# Patient Record
Sex: Male | Born: 1935 | Race: White | Hispanic: No | Marital: Married | Smoking: Former smoker
Health system: Southern US, Community
[De-identification: ages and names within clinical notes are randomized; demographics above are authoritative.]

## PROBLEM LIST (undated history)

## (undated) DIAGNOSIS — J449 Chronic obstructive pulmonary disease, unspecified: Secondary | ICD-10-CM

## (undated) DIAGNOSIS — E785 Hyperlipidemia, unspecified: Secondary | ICD-10-CM

## (undated) DIAGNOSIS — G20 Parkinson's disease: Secondary | ICD-10-CM

## (undated) DIAGNOSIS — F419 Anxiety disorder, unspecified: Secondary | ICD-10-CM

## (undated) DIAGNOSIS — K219 Gastro-esophageal reflux disease without esophagitis: Secondary | ICD-10-CM

## (undated) DIAGNOSIS — I10 Essential (primary) hypertension: Secondary | ICD-10-CM

## (undated) DIAGNOSIS — J45909 Unspecified asthma, uncomplicated: Secondary | ICD-10-CM

## (undated) DIAGNOSIS — G20A1 Parkinson's disease without dyskinesia, without mention of fluctuations: Secondary | ICD-10-CM

---

## 2016-10-17 ENCOUNTER — Emergency Department (HOSPITAL_BASED_OUTPATIENT_CLINIC_OR_DEPARTMENT_OTHER): Payer: Medicare Other

## 2016-10-17 ENCOUNTER — Emergency Department (HOSPITAL_BASED_OUTPATIENT_CLINIC_OR_DEPARTMENT_OTHER)
Admission: EM | Admit: 2016-10-17 | Discharge: 2016-10-17 | Disposition: A | Discharge status: Home / Self Care | Payer: Medicare Other | Attending: Emergency Medicine | Admitting: Emergency Medicine

## 2016-10-17 ENCOUNTER — Encounter (HOSPITAL_BASED_OUTPATIENT_CLINIC_OR_DEPARTMENT_OTHER): Payer: Self-pay | Admitting: Emergency Medicine

## 2016-10-17 DIAGNOSIS — K117 Disturbances of salivary secretion: Secondary | ICD-10-CM | POA: Diagnosis not present

## 2016-10-17 DIAGNOSIS — G20 Parkinson's disease: Secondary | ICD-10-CM | POA: Diagnosis not present

## 2016-10-17 DIAGNOSIS — Z87891 Personal history of nicotine dependence: Secondary | ICD-10-CM | POA: Insufficient documentation

## 2016-10-17 DIAGNOSIS — Z7982 Long term (current) use of aspirin: Secondary | ICD-10-CM | POA: Insufficient documentation

## 2016-10-17 DIAGNOSIS — I10 Essential (primary) hypertension: Secondary | ICD-10-CM | POA: Diagnosis not present

## 2016-10-17 DIAGNOSIS — Z79899 Other long term (current) drug therapy: Secondary | ICD-10-CM | POA: Diagnosis not present

## 2016-10-17 DIAGNOSIS — J45909 Unspecified asthma, uncomplicated: Secondary | ICD-10-CM | POA: Diagnosis not present

## 2016-10-17 DIAGNOSIS — J449 Chronic obstructive pulmonary disease, unspecified: Secondary | ICD-10-CM | POA: Diagnosis not present

## 2016-10-17 HISTORY — DX: Parkinson's disease: G20

## 2016-10-17 HISTORY — DX: Gastro-esophageal reflux disease without esophagitis: K21.9

## 2016-10-17 HISTORY — DX: Unspecified asthma, uncomplicated: J45.909

## 2016-10-17 HISTORY — DX: Essential (primary) hypertension: I10

## 2016-10-17 HISTORY — DX: Anxiety disorder, unspecified: F41.9

## 2016-10-17 HISTORY — DX: Parkinson's disease without dyskinesia, without mention of fluctuations: G20.A1

## 2016-10-17 HISTORY — DX: Chronic obstructive pulmonary disease, unspecified: J44.9

## 2016-10-17 HISTORY — DX: Hyperlipidemia, unspecified: E78.5

## 2016-10-17 MED ORDER — DOXYCYCLINE HYCLATE 100 MG PO CAPS: 100.0000 mg | ORAL_CAPSULE | Freq: Two times a day (BID) | ORAL | 0 refills | Status: DC

## 2016-10-17 MED ORDER — HYDROCODONE-HOMATROPINE 5-1.5 MG/5ML PO SYRP: 5.0000 mL | ORAL_SOLUTION | Freq: Four times a day (QID) | ORAL | 0 refills | Status: DC | PRN

## 2016-10-17 NOTE — ED Notes (Signed)
Pt given d/c instructions as per chart. Verbalizes understanding. No questions. 

## 2016-10-17 NOTE — Discharge Instructions (Signed)
You may stop taking Augmentin as this may have caused your painful swallowing. Please follow up with your ENT and gastroenterologist for your difficulty swallowing.  Return to the ED for sudden worsening symptoms, cough, choking, fever, or any new or concerning symptoms.

## 2016-10-17 NOTE — ED Triage Notes (Signed)
Pt wife sts pt began drooling about 1 hr ago; drooling is normal for pt, but this was more than usual; pt c/o nausea and vomited x 6

## 2016-10-17 NOTE — ED Notes (Addendum)
Pt has been taking an abx (can't remember the name or what its for) for 1 week. Pt states he took the dose today and an hour afterward started drooling a lot. Pt states he swallowed his saliva and then vomited several times. Pt wife states the drool was so bad it was all over the floor. Pt normally has difficulty swallowing due to parkinson's. Pt states he feel fine now. Pt wife states he appeared to have some difficulty breathing.

## 2016-10-17 NOTE — ED Notes (Signed)
Pt given sprite. Per pt and wife, able to drink as normal.

## 2016-10-17 NOTE — ED Provider Notes (Signed)
[facility]   CSN: [id number] Arrival date & time: 10/17/16  1335     History   Chief Complaint Chief Complaint  Patient presents with  . Drooling    HPI Casey Moses is a 80 y.o. male.  HPI Casey Moses is a 80 y.o. male with PMH significant for anxiety, asthma, COPD, GERD, HLD, HTN, and Parkinson's disease who presents with one episode of now resolved drooling that occurred 3 hours ago.  Patient states he was eating apple sauce with his antibiotic(Augmentin) mashed in it when he experienced painful swallowing causing him to vomit it back up and drool.  He has since returned to baseline.  He has issues with swallowing for which he sees ENT.  No numbness, weakness, facial droop, or slurred speech. He denies severe headache, confusion, chest pain, or shortness of breath.  Past Medical History:  Diagnosis Date  . Anxiety   . Asthma   . COPD (chronic obstructive pulmonary disease) (HCC)   . GERD (gastroesophageal reflux disease)   . Hyperlipidemia   . Hypertension   . Parkinson's disease (HCC)     There are no active problems to display for this patient.   History reviewed. No pertinent surgical history.     Home Medications    Prior to Admission medications   Medication Sig Start Date End Date Taking? Authorizing Provider  ALPRAZolam Prudy Feeler(XANAX) 0.5 MG tablet Take 0.5 mg by mouth at bedtime as needed for anxiety.   Yes Historical Provider, MD  aspirin EC 81 MG tablet Take 81 mg by mouth daily.   Yes Historical Provider, MD  atenolol (TENORMIN) 50 MG tablet Take 50 mg by mouth daily.   Yes Historical Provider, MD  atorvastatin (LIPITOR) 40 MG tablet Take 40 mg by mouth daily.   Yes Historical Provider, MD  carbidopa-levodopa (SINEMET IR) 10-100 MG tablet Take 1 tablet by mouth 3 (three) times daily.   Yes Historical Provider, MD  donepezil (ARICEPT) 5 MG tablet Take 5 mg by mouth at bedtime.   Yes Historical Provider, MD    Fluticasone-Salmeterol (ADVAIR) 100-50 MCG/DOSE AEPB Inhale 1 puff into the lungs 2 (two) times daily.   Yes Historical Provider, MD  omeprazole (PRILOSEC) 10 MG capsule Take 10 mg by mouth daily.   Yes Historical Provider, MD  sertraline (ZOLOFT) 50 MG tablet Take 50 mg by mouth daily.   Yes Historical Provider, MD  tamsulosin (FLOMAX) 0.4 MG CAPS capsule Take 0.4 mg by mouth.   Yes Historical Provider, MD    Family History History reviewed. No pertinent family history.  Social History Social History  Substance Use Topics  . Smoking status: Former Games developermoker  . Smokeless tobacco: Never Used  . Alcohol use Yes     Comment: wine occ     Allergies   Patient has no known allergies.   Review of Systems Review of Systems All other systems negative unless otherwise stated in HPI   Physical Exam Updated Vital Signs BP (!) 203/93 (BP Location: Left Arm)   Pulse 62   Temp 97.8 F (36.6 C) (Oral)   Resp 18   Ht 5\' 10"  (1.778 m)   Wt 74.8 kg   SpO2 97%   BMI 23.68 kg/m   Physical Exam  Constitutional: He is oriented to person, place, and time. He appears well-developed and well-nourished.  Non-toxic appearance. He does not have a sickly appearance. He does not appear ill.  HENT:  Head: Normocephalic and atraumatic.  Mouth/Throat: Oropharynx is clear and moist.  Eyes: Conjunctivae are normal. Pupils are equal, round, and reactive to light.  Neck: Normal range of motion. Neck supple.  Cardiovascular: Normal rate and regular rhythm.   Pulmonary/Chest: Effort normal and breath sounds normal. No accessory muscle usage or stridor. No respiratory distress. He has no wheezes. He has no rhonchi. He has no rales.  Abdominal: Soft. Bowel sounds are normal. He exhibits no distension. There is no tenderness.  Musculoskeletal: Normal range of motion.  Lymphadenopathy:    He has no cervical adenopathy.  Neurological: He is alert and oriented to person, place, and time.  Speech clear  without dysarthria. Cranial nerves grossly intact.  Normal strength and sensation throughout bilateral upper and lower extremities.  No pronator drift.  Normal finger to nose bilaterally.  Tremor to lips and right arm.   Skin: Skin is warm and dry.  Psychiatric: He has a normal mood and affect. His behavior is normal.     ED Treatments / Results  Labs (all labs ordered are listed, but only abnormal results are displayed) Labs Reviewed - No data to display  EKG  EKG Interpretation None       Radiology No results found.  Procedures Procedures (including critical care time)  Medications Ordered in ED Medications - No data to display   Initial Impression / Assessment and Plan / ED Course  I have reviewed the triage vital signs and the nursing notes.  Pertinent labs & imaging results that were available during my care of the patient were reviewed by me and considered in my medical decision making (see chart for details).  Clinical Course    Patient presents with now resolved episode of drooling. Patient with history of dysphasia and drooling due to Parkinson's disease. No focal neurologic deficits on exam. Patient states he is at his baseline. He is able to tolerate by mouth without difficulty. Could be related to esophagitis due to Augmentin as this is when it occurred. Recommend stopping this. His BP is elevated; however, he has no chest pain, shortness of breath, altered mental status, or any other concerning symptoms. Plan to discharge home with ENT and gastroenterology follow-up. Return precautions discussed. Stable for discharge.  Case has been discussed with and seen by Dr. Madilyn Hookees who agrees with the above plan for discharge.  Final Clinical Impressions(s) / ED Diagnoses   Final diagnoses:  Drooling    New Prescriptions Current Discharge Medication List       Cheri FowlerKayla Talik Casique, Cordelia Poche-C 10/17/16 1616    Tilden FossaElizabeth Rees, MD 10/18/16 1355

## 2017-10-16 ENCOUNTER — Encounter (HOSPITAL_BASED_OUTPATIENT_CLINIC_OR_DEPARTMENT_OTHER): Payer: Self-pay | Admitting: *Deleted

## 2017-10-16 ENCOUNTER — Emergency Department (HOSPITAL_BASED_OUTPATIENT_CLINIC_OR_DEPARTMENT_OTHER): Payer: Medicare Other

## 2017-10-16 ENCOUNTER — Emergency Department (HOSPITAL_BASED_OUTPATIENT_CLINIC_OR_DEPARTMENT_OTHER)
Admission: EM | Admit: 2017-10-16 | Discharge: 2017-10-16 | Disposition: A | Discharge status: Home / Self Care | Payer: Medicare Other | Attending: Emergency Medicine | Admitting: Emergency Medicine

## 2017-10-16 ENCOUNTER — Other Ambulatory Visit: Payer: Self-pay

## 2017-10-16 DIAGNOSIS — R531 Weakness: Secondary | ICD-10-CM

## 2017-10-16 DIAGNOSIS — Z79899 Other long term (current) drug therapy: Secondary | ICD-10-CM | POA: Insufficient documentation

## 2017-10-16 DIAGNOSIS — I10 Essential (primary) hypertension: Secondary | ICD-10-CM | POA: Insufficient documentation

## 2017-10-16 DIAGNOSIS — J449 Chronic obstructive pulmonary disease, unspecified: Secondary | ICD-10-CM | POA: Insufficient documentation

## 2017-10-16 DIAGNOSIS — Z87891 Personal history of nicotine dependence: Secondary | ICD-10-CM | POA: Diagnosis not present

## 2017-10-16 DIAGNOSIS — G20 Parkinson's disease: Secondary | ICD-10-CM | POA: Diagnosis not present

## 2017-10-16 DIAGNOSIS — J45909 Unspecified asthma, uncomplicated: Secondary | ICD-10-CM | POA: Insufficient documentation

## 2017-10-16 DIAGNOSIS — R001 Bradycardia, unspecified: Secondary | ICD-10-CM | POA: Diagnosis not present

## 2017-10-16 DIAGNOSIS — R42 Dizziness and giddiness: Secondary | ICD-10-CM | POA: Diagnosis present

## 2017-10-16 DIAGNOSIS — Z7982 Long term (current) use of aspirin: Secondary | ICD-10-CM | POA: Insufficient documentation

## 2017-10-16 DIAGNOSIS — E785 Hyperlipidemia, unspecified: Secondary | ICD-10-CM | POA: Diagnosis not present

## 2017-10-16 LAB — COMPREHENSIVE METABOLIC PANEL
ALBUMIN: 4.2 g/dL (ref 3.5–5.0)
ALK PHOS: 67 U/L (ref 38–126)
ALT: 10 U/L — ABNORMAL LOW (ref 17–63)
ANION GAP: 9 (ref 5–15)
AST: 27 U/L (ref 15–41)
BUN: 21 mg/dL — ABNORMAL HIGH (ref 6–20)
CHLORIDE: 102 mmol/L (ref 101–111)
CO2: 28 mmol/L (ref 22–32)
Calcium: 8.8 mg/dL — ABNORMAL LOW (ref 8.9–10.3)
Creatinine, Ser: 1.15 mg/dL (ref 0.61–1.24)
GFR calc Af Amer: >60 mL/min (ref >60)
GFR calc non Af Amer: 59 mL/min — ABNORMAL LOW (ref >60)
GLUCOSE: 107 mg/dL — AB (ref 65–99)
POTASSIUM: 3.5 mmol/L (ref 3.5–5.1)
SODIUM: 139 mmol/L (ref 135–145)
Total Bilirubin: 1.3 mg/dL — ABNORMAL HIGH (ref 0.3–1.2)
Total Protein: 6.8 g/dL (ref 6.5–8.1)

## 2017-10-16 LAB — URINALYSIS, ROUTINE W REFLEX MICROSCOPIC
Bilirubin Urine: NEGATIVE
GLUCOSE, UA: NEGATIVE mg/dL
Hgb urine dipstick: NEGATIVE
Ketones, ur: 15 mg/dL — AB
NITRITE: NEGATIVE
PH: 6 (ref 5.0–8.0)
Protein, ur: 100 mg/dL — AB
SPECIFIC GRAVITY, URINE: 1.025 (ref 1.005–1.030)

## 2017-10-16 LAB — CBC
HEMATOCRIT: 42.9 % (ref 39.0–52.0)
HEMOGLOBIN: 14.7 g/dL (ref 13.0–17.0)
MCH: 31.7 pg (ref 26.0–34.0)
MCHC: 34.3 g/dL (ref 30.0–36.0)
MCV: 92.7 fL (ref 78.0–100.0)
PLATELETS: 192 10*3/uL (ref 150–400)
RBC: 4.63 MIL/uL (ref 4.22–5.81)
RDW: 13.1 % (ref 11.5–15.5)
WBC: 8.1 10*3/uL (ref 4.0–10.5)

## 2017-10-16 LAB — URINALYSIS, MICROSCOPIC (REFLEX)

## 2017-10-16 MED ORDER — ATENOLOL 25 MG PO TABS: 25.0000 mg | ORAL_TABLET | Freq: Every day | ORAL | 0 refills | Status: DC

## 2017-10-16 MED ORDER — CEPHALEXIN 500 MG PO CAPS: 500.0000 mg | ORAL_CAPSULE | Freq: Three times a day (TID) | ORAL | 0 refills | Status: DC

## 2017-10-16 MED ORDER — CEPHALEXIN 250 MG PO CAPS
500.0000 mg | ORAL_CAPSULE | Freq: Once | ORAL | Status: AC
  Administered 2017-10-16: 500 mg via ORAL
  Filled 2017-10-16: qty 2

## 2017-10-16 NOTE — Discharge Instructions (Signed)
It was our pleasure to provide your ER care today - we hope that you feel better.  Drink plenty of fluids (6-8 glasses of water per day).  Consider supplementing diet with nutritious shake such as boost or ensure.   The lab tests show a possible urine infection - take keflex (antibiotic) as prescribed.  Your heart rate is low (50) - decrease the dose of your atenolol to 25 mg a day (instead of 50 mg a day).   Follow up with your doctor next week for recheck.  Return to ER if worse, new symptoms, fainting, trouble breathing, high  fevers, other concern.

## 2017-10-16 NOTE — ED Notes (Signed)
Patient transported to CT 

## 2017-10-16 NOTE — ED Triage Notes (Signed)
Wife states dizziness x 5 days

## 2017-10-16 NOTE — ED Notes (Signed)
Pt drinking soda

## 2017-10-16 NOTE — ED Notes (Signed)
Pt unable to void at this time. 

## 2017-10-16 NOTE — ED Provider Notes (Signed)
[facility] EMERGENCY DEPARTMENT Provider Note   CSN: [id number] Arrival date & time: 10/16/17  [id number]     History   Chief Complaint Chief Complaint  Patient presents with  . Dizziness    HPI Casey Moses is a 81 y.o. male.  Patient c/o lightheadedness, shakiness, and generalized weakness in the past 3-4 days. Symptoms gradual onset, persistent, constant. Symptoms occur at rest, no relation to exertion. No syncope, but feels weak/lightheaded/dizzy when stands. Denies room spinning.  Did fall 2 weeks ago - denies specific injury then. Denies headache. No neck or back pain. Patient denies fever or chills. No cough or uri c/o. No chest pain or discomfort. Denies abd pain. No nvd. Spouse indicates drinks relatively little in fluids/water. Pt denies gu c/o, no dysuria or odor to urine. Denies unilateral or focal numbness or weakness. No change in speech or vision. Denies any recent change in meds.    The history is provided by the patient.  Dizziness  Associated symptoms: no chest pain, no diarrhea, no headaches, no shortness of breath and no vomiting     Past Medical History:  Diagnosis Date  . Anxiety   . Asthma   . COPD (chronic obstructive pulmonary disease) (HCC)   . GERD (gastroesophageal reflux disease)   . Hyperlipidemia   . Hypertension   . Parkinson's disease (HCC)     There are no active problems to display for this patient.   History reviewed. No pertinent surgical history.     Home Medications    Prior to Admission medications   Medication Sig Start Date End Date Taking? Authorizing Provider  ALPRAZolam Prudy Feeler(XANAX) 0.5 MG tablet Take 0.5 mg by mouth at bedtime as needed for anxiety.    [provider]  aspirin EC 81 MG tablet Take 81 mg by mouth daily.    [provider]  atenolol (TENORMIN) 50 MG tablet Take 50 mg by mouth daily.    [provider]  atorvastatin (LIPITOR) 40 MG tablet Take 40 mg by mouth daily.     [provider]  carbidopa-levodopa (SINEMET IR) 10-100 MG tablet Take 1 tablet by mouth 3 (three) times daily.    [provider]  donepezil (ARICEPT) 5 MG tablet Take 5 mg by mouth at bedtime.    [provider]  Fluticasone-Salmeterol (ADVAIR) 100-50 MCG/DOSE AEPB Inhale 1 puff into the lungs 2 (two) times daily.    [provider]  omeprazole (PRILOSEC) 10 MG capsule Take 10 mg by mouth daily.    [provider]  sertraline (ZOLOFT) 50 MG tablet Take 50 mg by mouth daily.    [provider]  tamsulosin (FLOMAX) 0.4 MG CAPS capsule Take 0.4 mg by mouth.    [provider]    Family History History reviewed. No pertinent family history.  Social History Social History   Tobacco Use  . Smoking status: Former Games developermoker  . Smokeless tobacco: Never Used  Substance Use Topics  . Alcohol use: Yes    Comment: wine occ  . Drug use: No     Allergies   Patient has no known allergies.   Review of Systems Review of Systems  Constitutional: Negative for fever.  HENT: Negative for sore throat.   Eyes: Negative for visual disturbance.  Respiratory: Negative for shortness of breath.   Cardiovascular: Negative for chest pain.  Gastrointestinal: Negative for abdominal pain, diarrhea and vomiting.  Genitourinary: Negative for flank pain.  Musculoskeletal: Negative for back pain  and neck pain.  Skin: Negative for rash.  Neurological: Positive for dizziness and light-headedness. Negative for headaches.  Hematological: Does not bruise/bleed easily.  Psychiatric/Behavioral: Negative for confusion.     Physical Exam Updated Vital Signs BP (!) 147/87   Pulse (!) 54   Temp 98.2 F (36.8 C)   Resp 16   Ht 1.765 m (5' 9.5")   Wt 74.8 kg (165 lb)   SpO2 98%   BMI 24.02 kg/m   Physical Exam  Constitutional: He appears well-developed and well-nourished. No distress.  HENT:  Mouth/Throat: Oropharynx is clear and moist.  Eyes:  Conjunctivae are normal.  Neck: Neck supple. No tracheal deviation present. No thyromegaly present.  No bruits.   Cardiovascular: Regular rhythm, normal heart sounds and intact distal pulses. Exam reveals no gallop and no friction rub.  No murmur heard. Mildly bradycardic.   Pulmonary/Chest: Effort normal and breath sounds normal. No accessory muscle usage. No respiratory distress.  Abdominal: Soft. Bowel sounds are normal. He exhibits no distension. There is no tenderness.  Genitourinary:  Genitourinary Comments: No cva tenderness  Musculoskeletal: He exhibits no edema.  Neurological: He is alert.  Tremor (hx Parkinsons). Motor intact bil, stre 5/5. sens grossly intact. No pronator drift. Steady gait.  Skin: Skin is warm and dry. No rash noted. He is not diaphoretic.  Psychiatric: He has a normal mood and affect.  Nursing note and vitals reviewed.    ED Treatments / Results  Labs (all labs ordered are listed, but only abnormal results are displayed) Results for orders placed or performed during the hospital encounter of 10/16/17  CBC  Result Value Ref Range   WBC 8.1 4.0 - 10.5 K/uL   RBC 4.63 4.22 - 5.81 MIL/uL   Hemoglobin 14.7 13.0 - 17.0 g/dL   HCT 69.6 29.5 - 28.4 %   MCV 92.7 78.0 - 100.0 fL   MCH 31.7 26.0 - 34.0 pg   MCHC 34.3 30.0 - 36.0 g/dL   RDW 13.2 44.0 - 10.2 %   Platelets 192 150 - 400 K/uL  Comprehensive metabolic panel  Result Value Ref Range   Sodium 139 135 - 145 mmol/L   Potassium 3.5 3.5 - 5.1 mmol/L   Chloride 102 101 - 111 mmol/L   CO2 28 22 - 32 mmol/L   Glucose, Bld 107 (H) 65 - 99 mg/dL   BUN 21 (H) 6 - 20 mg/dL   Creatinine, Ser 7.25 0.61 - 1.24 mg/dL   Calcium 8.8 (L) 8.9 - 10.3 mg/dL   Total Protein 6.8 6.5 - 8.1 g/dL   Albumin 4.2 3.5 - 5.0 g/dL   AST 27 15 - 41 U/L   ALT 10 (L) 17 - 63 U/L   Alkaline Phosphatase 67 38 - 126 U/L   Total Bilirubin 1.3 (H) 0.3 - 1.2 mg/dL   GFR calc non Af Amer 59 (L) >60 mL/min   GFR calc Af Amer >60  >60 mL/min   Anion gap 9 5 - 15  Urinalysis, Routine w reflex microscopic  Result Value Ref Range   Color, Urine AMBER (A) YELLOW   APPearance CLEAR CLEAR   Specific Gravity, Urine 1.025 1.005 - 1.030   pH 6.0 5.0 - 8.0   Glucose, UA NEGATIVE NEGATIVE mg/dL   Hgb urine dipstick NEGATIVE NEGATIVE   Bilirubin Urine NEGATIVE NEGATIVE   Ketones, ur 15 (A) NEGATIVE mg/dL   Protein, ur 366 (A) NEGATIVE mg/dL   Nitrite NEGATIVE NEGATIVE   Leukocytes, UA TRACE (  A) NEGATIVE  Urinalysis, Microscopic (reflex)  Result Value Ref Range   RBC / HPF 0-5 0 - 5 RBC/hpf   WBC, UA 0-5 0 - 5 WBC/hpf   Bacteria, UA MANY (A) NONE SEEN   Squamous Epithelial / LPF 0-5 (A) NONE SEEN   Mucus PRESENT    Hyaline Casts, UA PRESENT    Ca Oxalate Crys, UA PRESENT    Sperm, UA PRESENT    Ct Head Wo Contrast  Result Date: 10/16/2017 CLINICAL DATA:  Dizziness, hypertension EXAM: CT HEAD WITHOUT CONTRAST TECHNIQUE: Contiguous axial images were obtained from the base of the skull through the vertex without intravenous contrast. COMPARISON:  None. FINDINGS: Brain: Mild age related volume loss. No acute intracranial abnormality. Specifically, no hemorrhage, hydrocephalus, mass lesion, acute infarction, or significant intracranial injury. Vascular: No hyperdense vessel or unexpected calcification. Skull: No acute calvarial abnormality. Sinuses/Orbits: No acute finding. Other: None. IMPRESSION: No acute intracranial abnormality. Electronically Signed   By: Charlett NoseKevin  Dover M.D.   On: 10/16/2017 12:32    EKG  EKG Interpretation None       Radiology No results found.  Procedures Procedures (including critical care time)  Medications Ordered in ED Medications - No data to display   Initial Impression / Assessment and Plan / ED Course  I have reviewed the triage vital signs and the nursing notes.  Pertinent labs & imaging results that were available during my care of the patient were reviewed by me and considered  in my medical decision making (see chart for details).  Labs. Imaging study.   Reviewed nursing notes and prior charts for additional history.   Ecg.  Symptoms likely multifactorial.  Pt w poor po intake/poor po fluids - ua c/w mild dehydration. Patient w hr as low as 50, is on beta blocker, will cut dose in 1/2.  Possible uti on labs w many bacteria, will tx.   Pt taking po fluids.  Afebrile. Mental status c/w baseline.     Final Clinical Impressions(s) / ED Diagnoses   Final diagnoses:  None    ED Discharge Orders    None       Cathren LaineSteinl, Jordin Dambrosio, MD 10/16/17 1428

## 2017-10-16 NOTE — ED Notes (Signed)
Given soda per request

## 2017-10-16 NOTE — ED Notes (Signed)
Patient is A & O to his baseline.  Wife understood AVS instructions.

## 2020-02-24 ENCOUNTER — Inpatient Hospital Stay (HOSPITAL_BASED_OUTPATIENT_CLINIC_OR_DEPARTMENT_OTHER)
Admission: EM | Admit: 2020-02-24 | Discharge: 2020-03-16 | DRG: 085 | Disposition: E | Payer: Medicare Other | Attending: General Surgery | Admitting: General Surgery

## 2020-02-24 ENCOUNTER — Emergency Department (HOSPITAL_BASED_OUTPATIENT_CLINIC_OR_DEPARTMENT_OTHER): Payer: Medicare Other

## 2020-02-24 ENCOUNTER — Encounter (HOSPITAL_BASED_OUTPATIENT_CLINIC_OR_DEPARTMENT_OTHER): Payer: Self-pay | Admitting: Emergency Medicine

## 2020-02-24 ENCOUNTER — Other Ambulatory Visit: Payer: Self-pay

## 2020-02-24 ENCOUNTER — Observation Stay (HOSPITAL_BASED_OUTPATIENT_CLINIC_OR_DEPARTMENT_OTHER): Payer: Medicare Other

## 2020-02-24 DIAGNOSIS — Z681 Body mass index (BMI) 19 or less, adult: Secondary | ICD-10-CM | POA: Diagnosis not present

## 2020-02-24 DIAGNOSIS — S06360A Traumatic hemorrhage of cerebrum, unspecified, without loss of consciousness, initial encounter: Secondary | ICD-10-CM | POA: Diagnosis present

## 2020-02-24 DIAGNOSIS — H04129 Dry eye syndrome of unspecified lacrimal gland: Secondary | ICD-10-CM | POA: Diagnosis present

## 2020-02-24 DIAGNOSIS — G9341 Metabolic encephalopathy: Secondary | ICD-10-CM | POA: Diagnosis not present

## 2020-02-24 DIAGNOSIS — F0280 Dementia in other diseases classified elsewhere without behavioral disturbance: Secondary | ICD-10-CM | POA: Diagnosis present

## 2020-02-24 DIAGNOSIS — R471 Dysarthria and anarthria: Secondary | ICD-10-CM | POA: Diagnosis present

## 2020-02-24 DIAGNOSIS — E43 Unspecified severe protein-calorie malnutrition: Secondary | ICD-10-CM | POA: Diagnosis present

## 2020-02-24 DIAGNOSIS — K219 Gastro-esophageal reflux disease without esophagitis: Secondary | ICD-10-CM | POA: Diagnosis present

## 2020-02-24 DIAGNOSIS — S12110A Anterior displaced Type II dens fracture, initial encounter for closed fracture: Secondary | ICD-10-CM | POA: Diagnosis present

## 2020-02-24 DIAGNOSIS — Z888 Allergy status to other drugs, medicaments and biological substances status: Secondary | ICD-10-CM

## 2020-02-24 DIAGNOSIS — R4182 Altered mental status, unspecified: Secondary | ICD-10-CM | POA: Diagnosis not present

## 2020-02-24 DIAGNOSIS — D72829 Elevated white blood cell count, unspecified: Secondary | ICD-10-CM

## 2020-02-24 DIAGNOSIS — S80211A Abrasion, right knee, initial encounter: Secondary | ICD-10-CM | POA: Diagnosis present

## 2020-02-24 DIAGNOSIS — F419 Anxiety disorder, unspecified: Secondary | ICD-10-CM | POA: Diagnosis present

## 2020-02-24 DIAGNOSIS — E870 Hyperosmolality and hypernatremia: Secondary | ICD-10-CM | POA: Diagnosis not present

## 2020-02-24 DIAGNOSIS — E785 Hyperlipidemia, unspecified: Secondary | ICD-10-CM | POA: Diagnosis present

## 2020-02-24 DIAGNOSIS — R1310 Dysphagia, unspecified: Secondary | ICD-10-CM | POA: Diagnosis present

## 2020-02-24 DIAGNOSIS — Z9181 History of falling: Secondary | ICD-10-CM | POA: Diagnosis not present

## 2020-02-24 DIAGNOSIS — S12100A Unspecified displaced fracture of second cervical vertebra, initial encounter for closed fracture: Secondary | ICD-10-CM

## 2020-02-24 DIAGNOSIS — Z87891 Personal history of nicotine dependence: Secondary | ICD-10-CM

## 2020-02-24 DIAGNOSIS — Z515 Encounter for palliative care: Secondary | ICD-10-CM | POA: Diagnosis not present

## 2020-02-24 DIAGNOSIS — Z7189 Other specified counseling: Secondary | ICD-10-CM | POA: Diagnosis not present

## 2020-02-24 DIAGNOSIS — Z66 Do not resuscitate: Secondary | ICD-10-CM

## 2020-02-24 DIAGNOSIS — Z7951 Long term (current) use of inhaled steroids: Secondary | ICD-10-CM

## 2020-02-24 DIAGNOSIS — W010XXA Fall on same level from slipping, tripping and stumbling without subsequent striking against object, initial encounter: Secondary | ICD-10-CM | POA: Diagnosis present

## 2020-02-24 DIAGNOSIS — E876 Hypokalemia: Secondary | ICD-10-CM | POA: Diagnosis present

## 2020-02-24 DIAGNOSIS — Z20822 Contact with and (suspected) exposure to covid-19: Secondary | ICD-10-CM | POA: Diagnosis present

## 2020-02-24 DIAGNOSIS — I10 Essential (primary) hypertension: Secondary | ICD-10-CM | POA: Diagnosis present

## 2020-02-24 DIAGNOSIS — N390 Urinary tract infection, site not specified: Secondary | ICD-10-CM | POA: Diagnosis present

## 2020-02-24 DIAGNOSIS — J449 Chronic obstructive pulmonary disease, unspecified: Secondary | ICD-10-CM | POA: Diagnosis present

## 2020-02-24 DIAGNOSIS — Z7982 Long term (current) use of aspirin: Secondary | ICD-10-CM

## 2020-02-24 DIAGNOSIS — S60511A Abrasion of right hand, initial encounter: Secondary | ICD-10-CM | POA: Diagnosis present

## 2020-02-24 DIAGNOSIS — R0602 Shortness of breath: Secondary | ICD-10-CM

## 2020-02-24 DIAGNOSIS — S0633AA Contusion and laceration of cerebrum, unspecified, with loss of consciousness status unknown, initial encounter: Secondary | ICD-10-CM

## 2020-02-24 DIAGNOSIS — G20 Parkinson's disease: Secondary | ICD-10-CM | POA: Diagnosis present

## 2020-02-24 DIAGNOSIS — Z781 Physical restraint status: Secondary | ICD-10-CM

## 2020-02-24 DIAGNOSIS — I609 Nontraumatic subarachnoid hemorrhage, unspecified: Principal | ICD-10-CM

## 2020-02-24 DIAGNOSIS — R402434 Glasgow coma scale score 3-8, 24 hours or more after hospital admission: Secondary | ICD-10-CM | POA: Diagnosis not present

## 2020-02-24 DIAGNOSIS — R451 Restlessness and agitation: Secondary | ICD-10-CM | POA: Diagnosis not present

## 2020-02-24 DIAGNOSIS — Z79899 Other long term (current) drug therapy: Secondary | ICD-10-CM

## 2020-02-24 DIAGNOSIS — Z8249 Family history of ischemic heart disease and other diseases of the circulatory system: Secondary | ICD-10-CM

## 2020-02-24 LAB — BASIC METABOLIC PANEL
Anion gap: 10 (ref 5–15)
BUN: 21 mg/dL (ref 8–23)
CO2: 25 mmol/L (ref 22–32)
Calcium: 8.6 mg/dL — ABNORMAL LOW (ref 8.9–10.3)
Chloride: 106 mmol/L (ref 98–111)
Creatinine, Ser: 1.09 mg/dL (ref 0.61–1.24)
GFR calc Af Amer: >60 mL/min (ref >60)
GFR calc non Af Amer: >60 mL/min (ref >60)
Glucose, Bld: 101 mg/dL — ABNORMAL HIGH (ref 70–99)
Potassium: 3.4 mmol/L — ABNORMAL LOW (ref 3.5–5.1)
Sodium: 141 mmol/L (ref 135–145)

## 2020-02-24 LAB — CBC
HCT: 38.9 % — ABNORMAL LOW (ref 39.0–52.0)
Hemoglobin: 12.4 g/dL — ABNORMAL LOW (ref 13.0–17.0)
MCH: 29.2 pg (ref 26.0–34.0)
MCHC: 31.9 g/dL (ref 30.0–36.0)
MCV: 91.7 fL (ref 80.0–100.0)
Platelets: 234 10*3/uL (ref 150–400)
RBC: 4.24 MIL/uL (ref 4.22–5.81)
RDW: 14.4 % (ref 11.5–15.5)
WBC: 12.2 10*3/uL — ABNORMAL HIGH (ref 4.0–10.5)
nRBC: 0 % (ref 0.0–0.2)

## 2020-02-24 LAB — RESPIRATORY PANEL BY RT PCR (FLU A&B, COVID)
Influenza A by PCR: NEGATIVE
Influenza B by PCR: NEGATIVE
SARS Coronavirus 2 by RT PCR: NEGATIVE

## 2020-02-24 LAB — MRSA PCR SCREENING: MRSA by PCR: NEGATIVE

## 2020-02-24 MED ORDER — DONEPEZIL HCL 10 MG PO TABS
10.0000 mg | ORAL_TABLET | Freq: Every day | ORAL | Status: DC
  Administered 2020-02-24 – 2020-02-25 (×2): 10 mg via ORAL
  Filled 2020-02-24 (×2): qty 1

## 2020-02-24 MED ORDER — OXYCODONE HCL 5 MG PO TABS: 2.5000 mg | ORAL_TABLET | ORAL | Status: DC | PRN

## 2020-02-24 MED ORDER — DOCUSATE SODIUM 100 MG PO CAPS
100.0000 mg | ORAL_CAPSULE | Freq: Two times a day (BID) | ORAL | Status: DC
  Administered 2020-02-24 – 2020-02-25 (×2): 100 mg via ORAL
  Filled 2020-02-24 (×2): qty 1

## 2020-02-24 MED ORDER — ACETAMINOPHEN 500 MG PO TABS
1000.0000 mg | ORAL_TABLET | Freq: Once | ORAL | Status: AC
  Administered 2020-02-24: 1000 mg via ORAL
  Filled 2020-02-24: qty 2

## 2020-02-24 MED ORDER — AMLODIPINE BESYLATE 5 MG PO TABS
5.0000 mg | ORAL_TABLET | Freq: Every day | ORAL | Status: DC
  Administered 2020-02-24: 5 mg via ORAL
  Filled 2020-02-24 (×2): qty 1

## 2020-02-24 MED ORDER — ONDANSETRON 4 MG PO TBDP: 4.0000 mg | ORAL_TABLET | Freq: Four times a day (QID) | ORAL | Status: DC | PRN

## 2020-02-24 MED ORDER — OXYCODONE HCL 5 MG PO TABS
2.5000 mg | ORAL_TABLET | Freq: Once | ORAL | Status: AC
  Administered 2020-02-24: 2.5 mg via ORAL
  Filled 2020-02-24: qty 1

## 2020-02-24 MED ORDER — HALOPERIDOL LACTATE 5 MG/ML IJ SOLN
5.0000 mg | Freq: Four times a day (QID) | INTRAMUSCULAR | Status: DC | PRN
  Administered 2020-02-24: 5 mg via INTRAVENOUS
  Filled 2020-02-24: qty 1

## 2020-02-24 MED ORDER — LORAZEPAM 2 MG/ML IJ SOLN
0.5000 mg | INTRAMUSCULAR | Status: DC | PRN
  Administered 2020-02-24 – 2020-02-26 (×3): 0.5 mg via INTRAVENOUS
  Filled 2020-02-24 (×4): qty 1

## 2020-02-24 MED ORDER — CHLORHEXIDINE GLUCONATE CLOTH 2 % EX PADS
6.0000 | MEDICATED_PAD | Freq: Every day | CUTANEOUS | Status: DC
  Administered 2020-02-26 – 2020-03-01 (×5): 6 via TOPICAL

## 2020-02-24 MED ORDER — CARBIDOPA-LEVODOPA ER 50-200 MG PO TBCR
1.0000 | EXTENDED_RELEASE_TABLET | Freq: Three times a day (TID) | ORAL | Status: DC
  Administered 2020-02-24 – 2020-02-25 (×2): 1 via ORAL
  Filled 2020-02-24 (×5): qty 1

## 2020-02-24 MED ORDER — TAMSULOSIN HCL 0.4 MG PO CAPS
0.4000 mg | ORAL_CAPSULE | Freq: Every day | ORAL | Status: DC
  Administered 2020-02-24 – 2020-03-01 (×5): 0.4 mg via ORAL
  Filled 2020-02-24 (×6): qty 1

## 2020-02-24 MED ORDER — ACETAMINOPHEN 325 MG PO TABS: 650.0000 mg | ORAL_TABLET | ORAL | Status: DC | PRN

## 2020-02-24 MED ORDER — LEVETIRACETAM IN NACL 1000 MG/100ML IV SOLN
1000.0000 mg | Freq: Once | INTRAVENOUS | Status: AC
  Administered 2020-02-24: 1000 mg via INTRAVENOUS
  Filled 2020-02-24: qty 100

## 2020-02-24 MED ORDER — MOMETASONE FURO-FORMOTEROL FUM 100-5 MCG/ACT IN AERO
2.0000 | INHALATION_SPRAY | Freq: Two times a day (BID) | RESPIRATORY_TRACT | Status: DC
  Administered 2020-02-28 – 2020-02-29 (×4): 2 via RESPIRATORY_TRACT
  Filled 2020-02-24: qty 8.8

## 2020-02-24 MED ORDER — BOOST / RESOURCE BREEZE PO LIQD CUSTOM
1.0000 | Freq: Three times a day (TID) | ORAL | Status: DC
  Administered 2020-02-24 – 2020-02-25 (×2): 1 via ORAL

## 2020-02-24 MED ORDER — SERTRALINE HCL 50 MG PO TABS
100.0000 mg | ORAL_TABLET | Freq: Every day | ORAL | Status: DC
  Administered 2020-02-24: 100 mg via ORAL
  Filled 2020-02-24 (×2): qty 2

## 2020-02-24 MED ORDER — METOPROLOL TARTRATE 5 MG/5ML IV SOLN
5.0000 mg | Freq: Four times a day (QID) | INTRAVENOUS | Status: AC | PRN
  Administered 2020-02-24 – 2020-02-25 (×4): 5 mg via INTRAVENOUS
  Filled 2020-02-24 (×4): qty 5

## 2020-02-24 MED ORDER — ATENOLOL 50 MG PO TABS
25.0000 mg | ORAL_TABLET | Freq: Every day | ORAL | Status: DC
  Administered 2020-02-24: 25 mg via ORAL
  Filled 2020-02-24 (×2): qty 1

## 2020-02-24 MED ORDER — ONDANSETRON HCL 4 MG/2ML IJ SOLN: 4.0000 mg | Freq: Four times a day (QID) | INTRAMUSCULAR | Status: DC | PRN

## 2020-02-24 MED ORDER — OXYCODONE HCL 5 MG PO TABS: 5.0000 mg | ORAL_TABLET | ORAL | Status: DC | PRN

## 2020-02-24 MED ORDER — MORPHINE SULFATE (PF) 2 MG/ML IV SOLN
2.0000 mg | INTRAVENOUS | Status: DC | PRN
  Administered 2020-02-24 – 2020-02-26 (×2): 2 mg via INTRAVENOUS
  Administered 2020-03-01: 4 mg via INTRAVENOUS
  Administered 2020-03-01 (×2): 2 mg via INTRAVENOUS
  Administered 2020-03-01: 4 mg via INTRAVENOUS
  Filled 2020-02-24: qty 2
  Filled 2020-02-24: qty 1
  Filled 2020-02-24: qty 2
  Filled 2020-02-24 (×3): qty 1

## 2020-02-24 NOTE — H&P (Signed)
Activation and Reason: consult, fall from standing height  Casey Moses is an 84 y.o. male.  HPI: 84 yo male got up to go to the bathroom and lost balance and fell. He is unsure if he lost consciousness. He got up himself. He has pain in his neck now. Pain is constant. It is in the upper posterior neck. It does not radiate. He is moving all extremities.  Past Medical History:  Diagnosis Date  . Anxiety   . Asthma   . COPD (chronic obstructive pulmonary disease) (HCC)   . GERD (gastroesophageal reflux disease)   . Hyperlipidemia   . Hypertension   . Parkinson's disease (HCC)     History reviewed. No pertinent surgical history.  No family history on file.  Social History:  reports that he has quit smoking. He has never used smokeless tobacco. He reports current alcohol use. He reports that he does not use drugs.  Allergies: No Known Allergies  Medications: I have reviewed the patient's current medications.  Results for orders placed or performed during the hospital encounter of 02/15/2020 (from the past 48 hour(s))  CBC     Status: Abnormal   Collection Time: 03/08/2020  8:39 AM  Result Value Ref Range   WBC 12.2 (H) 4.0 - 10.5 K/uL   RBC 4.24 4.22 - 5.81 MIL/uL   Hemoglobin 12.4 (L) 13.0 - 17.0 g/dL   HCT 16.1 (L) 09.6 - 04.5 %   MCV 91.7 80.0 - 100.0 fL   MCH 29.2 26.0 - 34.0 pg   MCHC 31.9 30.0 - 36.0 g/dL   RDW 40.9 81.1 - 91.4 %   Platelets 234 150 - 400 K/uL   nRBC 0.0 0.0 - 0.2 %    Comment: Performed at [facility], [address] [place], [place] [address]  Basic metabolic panel     Status: Abnormal   Collection Time: 02/20/2020  8:39 AM  Result Value Ref Range   Sodium 141 135 - 145 mmol/L   Potassium 3.4 (L) 3.5 - 5.1 mmol/L   Chloride 106 98 - 111 mmol/L   CO2 25 22 - 32 mmol/L   Glucose, Bld 101 (H) 70 - 99 mg/dL    Comment: Glucose reference range applies only to samples taken after fasting for at least 8 hours.   BUN 21 8 - 23 mg/dL   Creatinine, Ser 6.21 0.61 - 1.24 mg/dL   Calcium 8.6 (L) 8.9 - 10.3 mg/dL   GFR calc non Af Amer >60 >60 mL/min   GFR calc Af Amer >60 >60 mL/min   Anion gap 10 5 - 15    Comment: Performed at [facility], [address] [place], [place] [address]  Respiratory Panel by RT PCR (Flu A&B, Covid) - Nasopharyngeal Swab     Status: None   Collection Time: 02/25/2020  9:05 AM   Specimen: Nasopharyngeal Swab  Result Value Ref Range   SARS Coronavirus 2 by RT PCR NEGATIVE NEGATIVE    Comment: (NOTE) SARS-CoV-2 target nucleic acids are NOT DETECTED. The SARS-CoV-2 RNA is generally detectable in upper respiratoy specimens during the acute phase of infection. The lowest concentration of SARS-CoV-2 viral copies this assay can detect is 131 copies/mL. A negative result does not preclude SARS-Cov-2 infection and should not be used as the sole basis for treatment or other patient management decisions. A negative result may occur with  improper specimen collection/handling, submission of specimen other than nasopharyngeal swab,  presence of viral mutation(s) within the areas targeted by this assay, and inadequate number of viral copies (<131 copies/mL). A negative result must be combined with clinical observations, patient history, and epidemiological information. The expected result is Negative. Fact Sheet for Patients:  https://www.moore.com/ Fact Sheet for Healthcare Providers:  https://www.young.biz/ This test is not yet ap proved or cleared by the [place] FDA and  has been authorized for detection and/or diagnosis of SARS-CoV-2 by FDA under an Emergency Use Authorization (EUA). This EUA will remain  in effect (meaning this test can be used) for the duration of the COVID-19 declaration under Section 564(b)(1) of the Act, 21 U.S.C. section 360bbb-3(b)(1), unless the authorization is terminated or revoked sooner.    Influenza A by PCR  NEGATIVE NEGATIVE   Influenza B by PCR NEGATIVE NEGATIVE    Comment: (NOTE) The Xpert Xpress SARS-CoV-2/FLU/RSV assay is intended as an aid in  the diagnosis of influenza from Nasopharyngeal swab specimens and  should not be used as a sole basis for treatment. Nasal washings and  aspirates are unacceptable for Xpert Xpress SARS-CoV-2/FLU/RSV  testing. Fact Sheet for Patients: https://www.moore.com/ Fact Sheet for Healthcare Providers: https://www.young.biz/ This test is not yet approved or cleared by the [place] FDA and  has been authorized for detection and/or diagnosis of SARS-CoV-2 by  FDA under an Emergency Use Authorization (EUA). This EUA will remain  in effect (meaning this test can be used) for the duration of the  Covid-19 declaration under Section 564(b)(1) of the Act, 21  U.S.C. section 360bbb-3(b)(1), unless the authorization is  terminated or revoked. Performed at [facility], [address], [place], [place] [address]     CT Head Wo Contrast  Result Date: Feb 27, 2020 CLINICAL DATA:  Fall, hematoma.  Dementia. EXAM: CT HEAD WITHOUT CONTRAST CT CERVICAL SPINE WITHOUT CONTRAST TECHNIQUE: Multidetector CT imaging of the head and cervical spine was performed following the standard protocol without intravenous contrast. Multiplanar CT image reconstructions of the cervical spine were also generated. COMPARISON:  Head CT 10/16/2017 FINDINGS: CT HEAD FINDINGS Brain: There several foci of discrete high-density hemorrhage along the cortex of the high RIGHT parietal lobe, LEFT parietal lobe and LEFT frontal lobe (image 24, 23, and 26 of series 2). Lesions are most likely subarachnoid hemorrhages. Punctate parenchymal hemorrhage in the high RIGHT frontal lobe on image 27/2). Additionally subarachnoid hemorrhage in the LEFT lateral temporal lobe (image 14/2). Findings consistent with combination parenchymal and subarachnoid hemorrhage,  small volume. There is no midline shift or mass effect. Intraventricular hemorrhage. No extra-axial fluid collections. No CT evidence of acute cortical infarction. Vascular: No hyperdense vessel or unexpected calcification. Skull: No skull fracture Sinuses/Orbits: Small amount fluid in the RIGHT maxillary sinus with mucosal wall thickening. Other: Large RIGHT frontal scalp hematoma without associated skull fracture. CT CERVICAL SPINE FINDINGS Alignment: Normal alignment of the vertebral bodies. Skull base and vertebrae: There is a horizontal fracture through the base of the dens of the C2 vertebral body. There is minimal posterior displacement of the superior fracture fragment by 1 mm (image 33/6). The body of the C2 vertebral body is intact. The C1 vertebral body is intact. The craniocervical junction is normal. No additional evidence of fracture of the lower cervical vertebral bodies. Soft tissues and spinal canal: No prevertebral fluid or swelling. No visible canal hematoma. Disc levels: Multiple as endplate spurring from C3-C7. Mild disc space narrowing and sclerosis. No acute subluxation. Upper chest: Negative. Other: None IMPRESSION: 1. Multiple small  foci of subarachnoid and parenchymal hemorrhage within the LEFT and RIGHT cerebral hemispheres most consistent with shear force trauma. Majority of the hemorrhages are subarachnoid. 2. No midline shift or mass effect. 3. Large RIGHT frontal scalp hematoma 4. Type II dens fracture with minimal (1 mm) posterior displacement the superior fracture fragment. 5. Multilevel disc osteophytic disease. Critical Value/emergent results were called by telephone at the time of interpretation on 03/15/2020 at 8:24 am to provider DAN FLOYD , who verbally acknowledged these results. Electronically Signed   By: Genevive Bi M.D.   On: 03/01/2020 08:27   CT Cervical Spine Wo Contrast  Result Date: 02/16/2020 CLINICAL DATA:  Fall, hematoma.  Dementia. EXAM: CT HEAD WITHOUT  CONTRAST CT CERVICAL SPINE WITHOUT CONTRAST TECHNIQUE: Multidetector CT imaging of the head and cervical spine was performed following the standard protocol without intravenous contrast. Multiplanar CT image reconstructions of the cervical spine were also generated. COMPARISON:  Head CT 10/16/2017 FINDINGS: CT HEAD FINDINGS Brain: There several foci of discrete high-density hemorrhage along the cortex of the high RIGHT parietal lobe, LEFT parietal lobe and LEFT frontal lobe (image 24, 23, and 26 of series 2). Lesions are most likely subarachnoid hemorrhages. Punctate parenchymal hemorrhage in the high RIGHT frontal lobe on image 27/2). Additionally subarachnoid hemorrhage in the LEFT lateral temporal lobe (image 14/2). Findings consistent with combination parenchymal and subarachnoid hemorrhage, small volume. There is no midline shift or mass effect. Intraventricular hemorrhage. No extra-axial fluid collections. No CT evidence of acute cortical infarction. Vascular: No hyperdense vessel or unexpected calcification. Skull: No skull fracture Sinuses/Orbits: Small amount fluid in the RIGHT maxillary sinus with mucosal wall thickening. Other: Large RIGHT frontal scalp hematoma without associated skull fracture. CT CERVICAL SPINE FINDINGS Alignment: Normal alignment of the vertebral bodies. Skull base and vertebrae: There is a horizontal fracture through the base of the dens of the C2 vertebral body. There is minimal posterior displacement of the superior fracture fragment by 1 mm (image 33/6). The body of the C2 vertebral body is intact. The C1 vertebral body is intact. The craniocervical junction is normal. No additional evidence of fracture of the lower cervical vertebral bodies. Soft tissues and spinal canal: No prevertebral fluid or swelling. No visible canal hematoma. Disc levels: Multiple as endplate spurring from C3-C7. Mild disc space narrowing and sclerosis. No acute subluxation. Upper chest: Negative. Other:  None IMPRESSION: 1. Multiple small foci of subarachnoid and parenchymal hemorrhage within the LEFT and RIGHT cerebral hemispheres most consistent with shear force trauma. Majority of the hemorrhages are subarachnoid. 2. No midline shift or mass effect. 3. Large RIGHT frontal scalp hematoma 4. Type II dens fracture with minimal (1 mm) posterior displacement the superior fracture fragment. 5. Multilevel disc osteophytic disease. Critical Value/emergent results were called by telephone at the time of interpretation on 03/10/2020 at 8:24 am to provider DAN FLOYD , who verbally acknowledged these results. Electronically Signed   By: Genevive Bi M.D.   On: 02/23/2020 08:27   DG Knee Complete 4 Views Right  Result Date: 02/17/2020 CLINICAL DATA:  RIGHT hand and foot pain following fall. EXAM: RIGHT KNEE - COMPLETE 4+ VIEW COMPARISON:  None. FINDINGS: No fracture of the proximal tibia or distal femur. Patella is normal. No joint effusion. IMPRESSION: No fracture or dislocation.  No joint effusion Electronically Signed   By: Genevive Bi M.D.   On: 02/28/2020 10:03   DG Hand Complete Right  Result Date: 03/06/2020 CLINICAL DATA:  Right hand pain post fall EXAM:  RIGHT HAND - COMPLETE 3+ VIEW COMPARISON:  None FINDINGS: First CMC degenerative changes. Degenerative changes about the distal interphalangeal joints with "cecal" appearance, associated with joint space narrowing. Mild erosion along the margin of the joint of the proximal interphalangeal joints of the second and third digits. No signs of acute fracture or dislocation. IMPRESSION: 1. No acute fracture or dislocation. 2. Findings of erosive osteoarthritis. Electronically Signed   By: Zetta Bills M.D.   On: 03/15/2020 10:04    Review of Systems  Constitutional: Negative for chills and fever.  HENT: Negative for hearing loss.   Eyes: Negative for blurred vision and double vision.  Respiratory: Negative for cough and hemoptysis.     Cardiovascular: Negative for chest pain and palpitations.  Gastrointestinal: Negative for abdominal pain, nausea and vomiting.  Genitourinary: Negative for dysuria and urgency.  Musculoskeletal: Negative for myalgias and neck pain.  Skin: Negative for itching and rash.  Neurological: Positive for headaches. Negative for dizziness and tingling.  Endo/Heme/Allergies: Does not bruise/bleed easily.  Psychiatric/Behavioral: Negative for depression and suicidal ideas.  All other systems reviewed and are negative.  PE Blood pressure (!) 144/77, pulse 70, temperature 98 F (36.7 C), temperature source Oral, resp. rate 16, height 5\' 10"  (1.778 m), weight 72.6 kg, SpO2 98 %. Constitutional: NAD; conversant; scalp contusion otherwise no deformities Eyes: Moist conjunctiva; no lid lag; anicteric; PERRL Neck: Trachea midline; no thyromegaly, collar in place Lungs: Normal respiratory effort; no tactile fremitus CV: RRR; no palpable thrills; no pitting edema GI: Abd soft, NT, ND; no palpable hepatosplenomegaly MSK: moves all extremities with good strength, unable to assess gait; no clubbing/cyanosis Psychiatric: Appropriate affect; alert and oriented x2 Lymphatic: No palpable cervical or axillary lymphadenopathy   Assessment/Plan: 84 yo male with fall from standing height with isolated neuro injuries of SAH and c2 dens fx type II with minimal displacement. No other injuries identified on exam. Baseline dementia -neurosurgery recommending repeat imaging in the morning and BP control -clear liquids -reorientation -PT/OT -pain control  Procedures: none  Arta Bruce Adellyn Capek 02/28/2020, 12:00 PM

## 2020-02-24 NOTE — ED Provider Notes (Signed)
[place] EMERGENCY DEPARTMENT Provider Note   CSN: [id number] Arrival date & time: 02/21/2020  3016     History Chief Complaint  Patient presents with  . Fall    Casey Moses is a 84 y.o. male.  84 yo M with a chief complaint of a fall.  Patient has a history of Parkinson's and has had falls in the past though none recently.  States that he was found in the bathroom on the ground.  Wife heard a thump.  She thinks due to her night light not working and she did not know that it was not on.  Patient does not remember exactly what happened.  Complaining of right head and left neck pain.  Denies other injury.  Was noted to have a small abrasion to the dorsal aspect of the right hand and on the knee.  Patient denies any significant pain there.  Denies cardiac pain denies chest pain denies abdominal pain.  The history is provided by the patient.  Fall This is a new problem. The current episode started 3 to 5 hours ago. The problem occurs constantly. The problem has not changed since onset.Associated symptoms include headaches. Pertinent negatives include no chest pain, no abdominal pain and no shortness of breath. Nothing aggravates the symptoms. Nothing relieves the symptoms. He has tried nothing for the symptoms. The treatment provided no relief.       Past Medical History:  Diagnosis Date  . Anxiety   . Asthma   . COPD (chronic obstructive pulmonary disease) ([place])   . GERD (gastroesophageal reflux disease)   . Hyperlipidemia   . Hypertension   . Parkinson's disease [facility])     Patient Active Problem List   Diagnosis Date Noted  . Dens fracture ([place]) 03/08/2020    History reviewed. No pertinent surgical history.     No family history on file.  Social History   Tobacco Use  . Smoking status: Former Research scientist (life sciences)  . Smokeless tobacco: Never Used  Substance Use Topics  . Alcohol use: Yes    Comment: wine occ  . Drug use: No    Home Medications Prior to Admission  medications   Medication Sig Start Date End Date Taking? Authorizing Provider  ALPRAZolam Duanne Moron) 0.5 MG tablet Take 0.5 mg by mouth at bedtime as needed for anxiety.    [provider]  aspirin EC 81 MG tablet Take 81 mg by mouth daily.    [provider]  atenolol (TENORMIN) 25 MG tablet Take 1 tablet (25 mg total) by mouth daily. 10/16/17   Lajean Saver, MD  atorvastatin (LIPITOR) 40 MG tablet Take 40 mg by mouth daily.    [provider]  carbidopa-levodopa (SINEMET IR) 10-100 MG tablet Take 1 tablet by mouth 3 (three) times daily.    [provider]  cephALEXin (KEFLEX) 500 MG capsule Take 1 capsule (500 mg total) by mouth 3 (three) times daily. 10/16/17   Lajean Saver, MD  donepezil (ARICEPT) 5 MG tablet Take 5 mg by mouth at bedtime.    [provider]  Fluticasone-Salmeterol (ADVAIR) 100-50 MCG/DOSE AEPB Inhale 1 puff into the lungs 2 (two) times daily.    [provider]  omeprazole (PRILOSEC) 10 MG capsule Take 10 mg by mouth daily.    [provider]  sertraline (ZOLOFT) 50 MG tablet Take 50 mg by mouth daily.    [provider]  tamsulosin (FLOMAX) 0.4 MG CAPS capsule Take 0.4 mg by mouth.  [provider]    Allergies    Patient has no known allergies.  Review of Systems   Review of Systems  Constitutional: Negative for chills and fever.  HENT: Negative for congestion and facial swelling.   Eyes: Negative for discharge and visual disturbance.  Respiratory: Negative for shortness of breath.   Cardiovascular: Negative for chest pain and palpitations.  Gastrointestinal: Negative for abdominal pain, diarrhea and vomiting.  Musculoskeletal: Positive for neck pain. Negative for arthralgias and myalgias.  Skin: Positive for wound (abrasion to the right frontal region). Negative for color change and rash.  Neurological: Positive for headaches. Negative for tremors and syncope.  Psychiatric/Behavioral:  Negative for confusion and dysphoric mood.    Physical Exam Updated Vital Signs BP (!) 172/67 (BP Location: Left Arm)   Pulse 71   Temp 98.1 F (36.7 C) (Oral)   Resp 14   Ht 5\' 10"  (1.778 m)   Wt 72.6 kg   SpO2 96%   BMI 22.96 kg/m   Physical Exam Vitals and nursing note reviewed.  Constitutional:      Appearance: He is well-developed.  HENT:     Head: Normocephalic.     Comments: Hematoma and abrasion to the right frontal region. Eyes:     Pupils: Pupils are equal, round, and reactive to light.  Neck:     Vascular: No JVD.     Comments: No obvious midline spinal tenderness.  Mild tenderness to the left paraspinal musculature worse at C 2 3 and 4. Cardiovascular:     Rate and Rhythm: Normal rate and regular rhythm.     Heart sounds: No murmur. No friction rub. No gallop.   Pulmonary:     Effort: No respiratory distress.     Breath sounds: No wheezing.  Abdominal:     General: There is no distension.     Tenderness: There is no abdominal tenderness. There is no guarding or rebound.  Musculoskeletal:        General: Normal range of motion.     Cervical back: Normal range of motion and neck supple.     Comments: Skin tear to the right dorsal hand.  No overlying bony tenderness.  Full range of motion of the fingers.  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  There is some mild bruising at the DIP.  No pain per the patient.  Mild bruising at the DIP of the third digit as well.  No pain per the patient.  He has an abrasion to the R knee.  Full range of motion without tenderness.  5 out of 5 muscle strength to bilateral upper and lower extremities.  No reported numbness or weakness.  Intact sensation to light touch in all distal nerve distributions.  Pulses 2+ and equal.  Skin:    Coloration: Skin is not pale.     Findings: No rash.  Neurological:     Mental Status: He is alert and oriented to person, place, and time.  Psychiatric:        Behavior:  Behavior normal.     ED Results / Procedures / Treatments   Labs (all labs ordered are listed, but only abnormal results are displayed) Labs Reviewed  CBC - Abnormal; Notable for the following components:      Result Value   WBC 12.2 (*)    Hemoglobin 12.4 (*)    HCT 38.9 (*)    All other components within normal limits  BASIC METABOLIC  PANEL - Abnormal; Notable for the following components:   Potassium 3.4 (*)    Glucose, Bld 101 (*)    Calcium 8.6 (*)    All other components within normal limits  RESPIRATORY PANEL BY RT PCR (FLU A&B, COVID)    EKG None  Radiology CT Head Wo Contrast  Result Date: 2020-06-20 CLINICAL DATA:  Fall, hematoma.  Dementia. EXAM: CT HEAD WITHOUT CONTRAST CT CERVICAL SPINE WITHOUT CONTRAST TECHNIQUE: Multidetector CT imaging of the head and cervical spine was performed following the standard protocol without intravenous contrast. Multiplanar CT image reconstructions of the cervical spine were also generated. COMPARISON:  Head CT 10/16/2017 FINDINGS: CT HEAD FINDINGS Brain: There several foci of discrete high-density hemorrhage along the cortex of the high RIGHT parietal lobe, LEFT parietal lobe and LEFT frontal lobe (image 24, 23, and 26 of series 2). Lesions are most likely subarachnoid hemorrhages. Punctate parenchymal hemorrhage in the high RIGHT frontal lobe on image 27/2). Additionally subarachnoid hemorrhage in the LEFT lateral temporal lobe (image 14/2). Findings consistent with combination parenchymal and subarachnoid hemorrhage, small volume. There is no midline shift or mass effect. Intraventricular hemorrhage. No extra-axial fluid collections. No CT evidence of acute cortical infarction. Vascular: No hyperdense vessel or unexpected calcification. Skull: No skull fracture Sinuses/Orbits: Small amount fluid in the RIGHT maxillary sinus with mucosal wall thickening. Other: Large RIGHT frontal scalp hematoma without associated skull fracture. CT CERVICAL  SPINE FINDINGS Alignment: Normal alignment of the vertebral bodies. Skull base and vertebrae: There is a horizontal fracture through the base of the dens of the C2 vertebral body. There is minimal posterior displacement of the superior fracture fragment by 1 mm (image 33/6). The body of the C2 vertebral body is intact. The C1 vertebral body is intact. The craniocervical junction is normal. No additional evidence of fracture of the lower cervical vertebral bodies. Soft tissues and spinal canal: No prevertebral fluid or swelling. No visible canal hematoma. Disc levels: Multiple as endplate spurring from C3-C7. Mild disc space narrowing and sclerosis. No acute subluxation. Upper chest: Negative. Other: None IMPRESSION: 1. Multiple small foci of subarachnoid and parenchymal hemorrhage within the LEFT and RIGHT cerebral hemispheres most consistent with shear force trauma. Majority of the hemorrhages are subarachnoid. 2. No midline shift or mass effect. 3. Large RIGHT frontal scalp hematoma 4. Type II dens fracture with minimal (1 mm) posterior displacement the superior fracture fragment. 5. Multilevel disc osteophytic disease. Critical Value/emergent results were called by telephone at the time of interpretation on 2020-06-20 at 8:24 am to provider Zan Orlick , who verbally acknowledged these results. Electronically Signed   By: Genevive BiStewart  Edmunds M.D.   On: 02021-08-05 08:27   CT Cervical Spine Wo Contrast  Result Date: 2020-06-20 CLINICAL DATA:  Fall, hematoma.  Dementia. EXAM: CT HEAD WITHOUT CONTRAST CT CERVICAL SPINE WITHOUT CONTRAST TECHNIQUE: Multidetector CT imaging of the head and cervical spine was performed following the standard protocol without intravenous contrast. Multiplanar CT image reconstructions of the cervical spine were also generated. COMPARISON:  Head CT 10/16/2017 FINDINGS: CT HEAD FINDINGS Brain: There several foci of discrete high-density hemorrhage along the cortex of the high RIGHT parietal  lobe, LEFT parietal lobe and LEFT frontal lobe (image 24, 23, and 26 of series 2). Lesions are most likely subarachnoid hemorrhages. Punctate parenchymal hemorrhage in the high RIGHT frontal lobe on image 27/2). Additionally subarachnoid hemorrhage in the LEFT lateral temporal lobe (image 14/2). Findings consistent with combination parenchymal and subarachnoid hemorrhage, small volume. There is no  midline shift or mass effect. Intraventricular hemorrhage. No extra-axial fluid collections. No CT evidence of acute cortical infarction. Vascular: No hyperdense vessel or unexpected calcification. Skull: No skull fracture Sinuses/Orbits: Small amount fluid in the RIGHT maxillary sinus with mucosal wall thickening. Other: Large RIGHT frontal scalp hematoma without associated skull fracture. CT CERVICAL SPINE FINDINGS Alignment: Normal alignment of the vertebral bodies. Skull base and vertebrae: There is a horizontal fracture through the base of the dens of the C2 vertebral body. There is minimal posterior displacement of the superior fracture fragment by 1 mm (image 33/6). The body of the C2 vertebral body is intact. The C1 vertebral body is intact. The craniocervical junction is normal. No additional evidence of fracture of the lower cervical vertebral bodies. Soft tissues and spinal canal: No prevertebral fluid or swelling. No visible canal hematoma. Disc levels: Multiple as endplate spurring from C3-C7. Mild disc space narrowing and sclerosis. No acute subluxation. Upper chest: Negative. Other: None IMPRESSION: 1. Multiple small foci of subarachnoid and parenchymal hemorrhage within the LEFT and RIGHT cerebral hemispheres most consistent with shear force trauma. Majority of the hemorrhages are subarachnoid. 2. No midline shift or mass effect. 3. Large RIGHT frontal scalp hematoma 4. Type II dens fracture with minimal (1 mm) posterior displacement the superior fracture fragment. 5. Multilevel disc osteophytic disease.  Critical Value/emergent results were called by telephone at the time of interpretation on Mar 18, 2020 at 8:24 am to provider Ayianna Darnold , who verbally acknowledged these results. Electronically Signed   By: Genevive Bi M.D.   On: 03-18-2020 08:27    Procedures Procedures (including critical care time)  Medications Ordered in ED Medications  acetaminophen (TYLENOL) tablet 1,000 mg (1,000 mg Oral Given Mar 18, 2020 0734)  oxyCODONE (Oxy IR/ROXICODONE) immediate release tablet 2.5 mg (2.5 mg Oral Given 18-Mar-2020 0734)  levETIRAcetam (KEPPRA) IVPB 1000 mg/100 mL premix (0 mg Intravenous Stopped 2020/03/18 9528)    ED Course  I have reviewed the triage vital signs and the nursing notes.  Pertinent labs & imaging results that were available during my care of the patient were reviewed by me and considered in my medical decision making (see chart for details).    MDM Rules/Calculators/A&P                      84 yo M with a chief complaints of a fall.  Unwitnessed.  Patient does not recall the event.  Complaining of headache and left-sided neck pain.  Found to have a dens fracture and scattered intraparenchymal and subarachnoid hemorrhage.  Discussed with neurosurgery, Dr. Maisie Fus.  Recommended that the patient hold his aspirin.  7 days of Keppra prophylaxis.  Increased MAPs.  Recommended trauma admission to the ICU.  Discussed with trauma.  Bed request entered.  Patient has scattered abrasions but nothing that needs repair.  His records were reviewed and he did have a tetanus updated in June 2019.  CRITICAL CARE Performed by: Rae Roam   Total critical care time: 80 minutes  Critical care time was exclusive of separately billable procedures and treating other patients.  Critical care was necessary to treat or prevent imminent or life-threatening deterioration.  Critical care was time spent personally by me on the following activities: development of treatment plan with patient and/or  surrogate as well as nursing, discussions with consultants, evaluation of patient's response to treatment, examination of patient, obtaining history from patient or surrogate, ordering and performing treatments and interventions, ordering and review of laboratory studies,  ordering and review of radiographic studies, pulse oximetry and re-evaluation of patient's condition.  The patients results and plan were reviewed and discussed.   Any x-rays performed were independently reviewed by myself.   Differential diagnosis were considered with the presenting HPI.  Medications  acetaminophen (TYLENOL) tablet 1,000 mg (1,000 mg Oral Given 21-Mar-2020 0734)  oxyCODONE (Oxy IR/ROXICODONE) immediate release tablet 2.5 mg (2.5 mg Oral Given Mar 21, 2020 0734)  levETIRAcetam (KEPPRA) IVPB 1000 mg/100 mL premix (0 mg Intravenous Stopped Mar 21, 2020 0921)    Vitals:   2020-03-21 0617 2020/03/21 0622 03/21/20 0627  BP:  (!) 172/67   Pulse:  71   Resp:  14   Temp:  98.1 F (36.7 C)   TempSrc:  Oral   SpO2: 98% 96%   Weight:   72.6 kg  Height:   5\' 10"  (1.778 m)    Final diagnoses:  SAH (subarachnoid hemorrhage) (HCC)  Closed odontoid fracture, initial encounter [facility])    Admission/ observation were discussed with the admitting physician, patient and/or family and they are comfortable with the plan.   Final Clinical Impression(s) / ED Diagnoses Final diagnoses:  SAH (subarachnoid hemorrhage) (HCC)  Closed odontoid fracture, initial encounter [facility])    Rx / DC Orders ED Discharge Orders    None       [facility], DO 03-21-2020 04/25/20

## 2020-02-24 NOTE — ED Triage Notes (Signed)
Pt brought in by EMS after a fall at home  Pt has a hematoma to the right side of his forehead, skin tear on right hand and right knee  Pt fell in the bathroom  Wife states he has dementia with periods of confusion   Pt states he does not remember what happened this morning  Pt is also c/o left side neck pain  Pt's wife heard him fall and went into the bathroom and found pt on the floor, awake

## 2020-02-24 NOTE — Evaluation (Signed)
Physical Therapy Evaluation Patient Details Name: Casey Moses MRN: [id number] DOB: 20-Feb-1936 Today's Date: 22-Mar-2020   History of Present Illness  84 yo male got up to go to the bathroom and lost balance and fell. Pt found to have isolated neuro injuries of SAH and c2 dens fx type II with minimal displacement. Pt has dementia and Parkinson's at baseline with history of falling  Clinical Impression  Pt presents to PT with deficits in cognition, safety awareness and awareness of deficits, balance. Pt with history of falls and is very unsteady during ambulation this session. Pt with no awareness of his current balance/mobility deficits, exacerbating his current falls risk. Pt also with impaired cognition compared to baseline. Pt will benefit from continued acute PT POC to reduce falls risk and restore his prior level of function.    Follow Up Recommendations CIR;Supervision/Assistance - 24 hour    Equipment Recommendations  3in1 (PT)    Recommendations for Other Services       Precautions / Restrictions Precautions Precautions: Fall(spinal) Precaution Comments: C-collar Required Braces or Orthoses: Cervical Brace Cervical Brace: Hard collar;At all times Restrictions Weight Bearing Restrictions: No      Mobility  Bed Mobility Overal bed mobility: Needs Assistance Bed Mobility: Supine to Sit;Sit to Supine     Supine to sit: Supervision Sit to supine: Min guard      Transfers Overall transfer level: Needs assistance Equipment used: None Transfers: Sit to/from Stand Sit to Stand: Min assist            Ambulation/Gait Ambulation/Gait assistance: Mod assist Gait Distance (Feet): 20 Feet Assistive device: None Gait Pattern/deviations: Staggering left;Staggering right Gait velocity: reduced Gait velocity interpretation: <1.8 ft/sec, indicate of risk for recurrent falls General Gait Details: pt with slowed step to gait, multiple LOB, staggering left and right  intermittently. Pt requires steadying from PT and verbal and tactile cues for direction during session  Stairs            Wheelchair Mobility    Modified Rankin (Stroke Patients Only) Modified Rankin (Stroke Patients Only) Pre-Morbid Rankin Score: Slight disability Modified Rankin: Moderately severe disability     Balance Overall balance assessment: Needs assistance Sitting-balance support: No upper extremity supported;Feet supported Sitting balance-Leahy Scale: Good Sitting balance - Comments: close supervision   Standing balance support: No upper extremity supported Standing balance-Leahy Scale: Poor Standing balance comment: minA for static standing balance                             Pertinent Vitals/Pain Pain Assessment: Faces Faces Pain Scale: Hurts even more Pain Location: head Pain Descriptors / Indicators: Aching Pain Intervention(s): Monitored during session    Home Living Family/patient expects to be discharged to:: Private residence Living Arrangements: Spouse/significant other Available Help at Discharge: Family;Available 24 hours/day Type of Home: House Home Access: Stairs to enter Entrance Stairs-Rails: Left Entrance Stairs-Number of Steps: 4 Home Layout: Two level;Able to live on main level with bedroom/bathroom Home Equipment: Dan Humphreys - 2 wheels;Other (comment)(bed rail)      Prior Function Level of Independence: Needs assistance   Gait / Transfers Assistance Needed: pt ambulates independently, history of falls  ADL's / Homemaking Assistance Needed: wife supervises ADLs        Hand Dominance        Extremity/Trunk Assessment   Upper Extremity Assessment Upper Extremity Assessment: Overall WFL for tasks assessed    Lower Extremity Assessment Lower Extremity Assessment:  Overall [facility] for tasks assessed    Cervical / Trunk Assessment Cervical / Trunk Assessment: Other exceptions Cervical / Trunk Exceptions: C-collar   Communication   Communication: No difficulties  Cognition Arousal/Alertness: Awake/alert Behavior During Therapy: Impulsive Overall Cognitive Status: Impaired/Different from baseline Area of Impairment: Orientation;Memory;Following commands;Safety/judgement;Awareness;Problem solving                 Orientation Level: Disoriented to;Time;Situation;Place   Memory: Decreased recall of precautions;Decreased short-term memory Following Commands: Follows one step commands consistently Safety/Judgement: Decreased awareness of safety;Decreased awareness of deficits Awareness: Intellectual Problem Solving: Slow processing;Difficulty sequencing;Decreased initiation;Requires verbal cues;Requires tactile cues General Comments: per family pt is maybe 20% off with cognition, history of dementia. Pt identifies wife as daughter this session and he is able to normally identify her as his wife, is able to later state they have been married for over 21 years      General Comments General comments (skin integrity, edema, etc.): BP in supine at 167/98, sitting 147/75, return to supine 168/76. Other VSS    Exercises     Assessment/Plan    PT Assessment Patient needs continued PT services  PT Problem List Decreased balance;Decreased cognition;Decreased knowledge of use of DME;Decreased safety awareness;Decreased knowledge of precautions       PT Treatment Interventions DME instruction;Gait training;Stair training;Functional mobility training;Therapeutic activities;Therapeutic exercise;Balance training;Neuromuscular re-education;Patient/family education    PT Goals (Current goals can be found in the Care Plan section)  Acute Rehab PT Goals Patient Stated Goal: To retun to prior level of function and eventually home PT Goal Formulation: With family Time For Goal Achievement: 03/09/20 Potential to Achieve Goals: Good    Frequency Min 4X/week   Barriers to discharge        Co-evaluation                AM-PAC PT "6 Clicks" Mobility  Outcome Measure Help needed turning from your back to your side while in a flat bed without using bedrails?: A Little Help needed moving from lying on your back to sitting on the side of a flat bed without using bedrails?: A Little Help needed moving to and from a bed to a chair (including a wheelchair)?: A Little Help needed standing up from a chair using your arms (e.g., wheelchair or bedside chair)?: A Little Help needed to walk in hospital room?: A Lot Help needed climbing 3-5 steps with a railing? : Total 6 Click Score: 15    End of Session Equipment Utilized During Treatment: Cervical collar Activity Tolerance: Patient tolerated treatment well Patient left: in bed;with call bell/phone within reach;with family/visitor present;with bed alarm set;with restraints reapplied Nurse Communication: Mobility status PT Visit Diagnosis: Unsteadiness on feet (R26.81);Repeated falls (R29.6);History of falling (Z91.81)    Time: 2440-1027 PT Time Calculation (min) (ACUTE ONLY): 26 min   Charges:   PT Evaluation $PT Eval Moderate Complexity: 1 Mod          Zenaida Niece, PT, DPT Acute Rehabilitation Pager: [phone]   Zenaida Niece 03/04/2020, 6:02 PM

## 2020-02-25 ENCOUNTER — Inpatient Hospital Stay (HOSPITAL_COMMUNITY): Payer: Medicare Other

## 2020-02-25 LAB — BASIC METABOLIC PANEL
Anion gap: 12 (ref 5–15)
BUN: 20 mg/dL (ref 8–23)
CO2: 25 mmol/L (ref 22–32)
Calcium: 9.2 mg/dL (ref 8.9–10.3)
Chloride: 108 mmol/L (ref 98–111)
Creatinine, Ser: 1.28 mg/dL — ABNORMAL HIGH (ref 0.61–1.24)
GFR calc Af Amer: >60 mL/min (ref >60)
GFR calc non Af Amer: 52 mL/min — ABNORMAL LOW (ref >60)
Glucose, Bld: 110 mg/dL — ABNORMAL HIGH (ref 70–99)
Potassium: 3.3 mmol/L — ABNORMAL LOW (ref 3.5–5.1)
Sodium: 145 mmol/L (ref 135–145)

## 2020-02-25 LAB — CBC
HCT: 37 % — ABNORMAL LOW (ref 39.0–52.0)
Hemoglobin: 12 g/dL — ABNORMAL LOW (ref 13.0–17.0)
MCH: 29.6 pg (ref 26.0–34.0)
MCHC: 32.4 g/dL (ref 30.0–36.0)
MCV: 91.4 fL (ref 80.0–100.0)
Platelets: 239 10*3/uL (ref 150–400)
RBC: 4.05 MIL/uL — ABNORMAL LOW (ref 4.22–5.81)
RDW: 14.6 % (ref 11.5–15.5)
WBC: 10.8 10*3/uL — ABNORMAL HIGH (ref 4.0–10.5)
nRBC: 0 % (ref 0.0–0.2)

## 2020-02-25 NOTE — Progress Notes (Signed)
Rehab Admissions Coordinator Note:  Per PT recommendation, this patient was screened by Cheri Rous for appropriateness for an Inpatient Acute Rehab Consult.  At this time, we are recommending Inpatient Rehab consult. AC will contact attending service to request order.   Cheri Rous 02/25/2020, 1:19 PM  I can be reached at [phone].

## 2020-02-25 NOTE — Consult Note (Signed)
Chief Complaint   Chief Complaint  Patient presents with  . Fall    History of Present Illness  Casey Moses is a 84 y.o. male with PD who suffered a fall at home.  Unclear LOC.  He was brought to the hospital complaining of neck pain.  He was found to have a type 2 dens fracture and tSAH.  He was on aspirin.  No focal neurologic complaints.  No headache.  Past Medical History   Past Medical History:  Diagnosis Date  . Anxiety   . Asthma   . COPD (chronic obstructive pulmonary disease) ([place])   . GERD (gastroesophageal reflux disease)   . Hyperlipidemia   . Hypertension   . Parkinson's disease [facility])     Past Surgical History  History reviewed. No pertinent surgical history.  Social History   Social History   Tobacco Use  . Smoking status: Former Research scientist (life sciences)  . Smokeless tobacco: Never Used  Substance Use Topics  . Alcohol use: Yes    Comment: wine occ  . Drug use: No    Medications   Prior to Admission medications   Medication Sig Start Date End Date Taking? Authorizing Provider  ALPRAZolam Duanne Moron) 0.5 MG tablet Take 0.5 mg by mouth at bedtime as needed for anxiety.    [provider]  aspirin EC 81 MG tablet Take 81 mg by mouth daily.    [provider]  atenolol (TENORMIN) 25 MG tablet Take 1 tablet (25 mg total) by mouth daily. 10/16/17   Lajean Saver, MD  atorvastatin (LIPITOR) 40 MG tablet Take 40 mg by mouth daily.    [provider]  carbidopa-levodopa (SINEMET IR) 10-100 MG tablet Take 1 tablet by mouth 3 (three) times daily.    [provider]  cephALEXin (KEFLEX) 500 MG capsule Take 1 capsule (500 mg total) by mouth 3 (three) times daily. 10/16/17   Lajean Saver, MD  donepezil (ARICEPT) 5 MG tablet Take 5 mg by mouth at bedtime.    [provider]  Fluticasone-Salmeterol (ADVAIR) 100-50 MCG/DOSE AEPB Inhale 1 puff into the lungs 2 (two) times daily.    [provider]  omeprazole (PRILOSEC) 10 MG capsule  Take 10 mg by mouth daily.    [provider]  sertraline (ZOLOFT) 50 MG tablet Take 50 mg by mouth daily.    [provider]  tamsulosin (FLOMAX) 0.4 MG CAPS capsule Take 0.4 mg by mouth.    [provider]    Allergies  No Known Allergies  Review of Systems  ROS  Neurologic Exam  Awake but drowsy.  Dysarthric speech, poor phonation.  Oriented to person, month, place.   R frontal scalp hematoma C-collar in place CN grossly intact RUE tremor noted Deconditioned but neurologically full strength x 4 Sensation grossly intact to LT  Imaging  CT head without contrast performed 4/10 and repeat performed 4/11 was reviewed.  There is several foci of small tSAH and intraparenchymal hemorrhages.  On repeat CT, these appear to be resolving.  CT C-spine shows a type 2 dens fracture, minimal displacement, no angulation.  Impression  - 84 y.o. man with PD, COPD who suffered a fall with resultant small tSAH/contusions and a type 2 odontoid fracture.  Plan  - Regarding his intracranial hemorrhage, I recommend holding aspirin for 1 week.   - I discussed with the son treatment of type 2 odontoid fractures in the elderly.  Given the patient's fragility, I do not recommend surgical fixation.  I will keep him in a C-collar for ~3 months to allow for fibrous union, with fall precautions being important.  He will need to f/u in clinic in 6 weeks.  All questions and concerns were answered. - agree with speech consult, as he is dysarthric and dysphagia from prevertebral swelling is common in the elderly.

## 2020-02-25 NOTE — Evaluation (Signed)
Occupational Therapy Evaluation Patient Details Name: Casey Moses MRN: [id number] DOB: 02-Jun-1936 Today's Date: 02/25/2020    History of Present Illness 84 yo male got up to go to the bathroom and lost balance and fell. Pt found to have isolated neuro injuries of SAH and C2 dens fx type II with minimal displacement. Pt has dementia and Parkinson's at baseline with history of falling   Clinical Impression   Pt PTA: Pt living with spouse and supervisionA overall for ADL and mobility. Spouse assisting with IADL due to parkinson's dx and dementia at baseline. Pt very hard to arouse today; keeping eyes closed and difficulty following commands. Mitts on hands and posey belt upon waist in bed. Pt maxA to totalA due for ADL and maxA for bed mobility overall and for sit to stand to take a few steps to [facility] due to drowsiness and restlessness today. OT to continue to assess vision as pt is more alert. BP: 121/73 sitting EOB 2L O2 94% O2 and HR 89 BPM; sitting EOB after exertion:105/76- unable to get BP in standing due to fatigue. Pt requires continued OT skilled services for ADL, mobility and cognition. When pt more alert, I presume pt will be appropriate for CIR to return to PLOF. OT following acutely.     Follow Up Recommendations  CIR    Equipment Recommendations  Other (comment)(to be determined)    Recommendations for Other Services       Precautions / Restrictions Precautions Precautions: Fall;Other (comment) Precaution Comments: C-collar Required Braces or Orthoses: Cervical Brace Cervical Brace: Hard collar;At all times Restrictions Weight Bearing Restrictions: No      Mobility Bed Mobility Overal bed mobility: Needs Assistance Bed Mobility: Rolling;Supine to Sit;Sit to Sidelying;Sit to Supine Rolling: Mod assist   Supine to sit: Mod assist Sit to supine: Max assist Sit to sidelying: Max assist General bed mobility comments: Pt requiring increased assist today for trunk  elevation, BLE management and overall sequencing through task.  Transfers Overall transfer level: Needs assistance Equipment used: Rolling walker (2 wheeled);None Transfers: Sit to/from Stand Sit to Stand: Max assist         General transfer comment: power up with maxA; requiring bed to lean on to take steps toward HOB.    Balance Overall balance assessment: Needs assistance Sitting-balance support: Bilateral upper extremity supported Sitting balance-Leahy Scale: Poor Sitting balance - Comments: assist as hands were in mitts unless when used for grooming   Standing balance support: Bilateral upper extremity supported;Single extremity supported Standing balance-Leahy Scale: Poor Standing balance comment: Pt modA for standing balance and assist from bed for pt's BLEs to lean on.                           ADL either performed or assessed with clinical judgement   ADL Overall ADL's : Needs assistance/impaired Eating/Feeding: NPO   Grooming: Maximal assistance Grooming Details (indicate cue type and reason): hand over hand assist to wash face; pt unable to arouse fully Upper Body Bathing: Maximal assistance;Sitting   Lower Body Bathing: Total assistance;Sitting/lateral leans;Sit to/from stand;Bed level   Upper Body Dressing : Maximal assistance   Lower Body Dressing: Total assistance   Toilet Transfer: Maximal assistance;+2 for physical assistance   Toileting- Clothing Manipulation and Hygiene: Maximal assistance;Total assistance;+2 for physical assistance;+2 for safety/equipment;Sitting/lateral lean;Sit to/from stand;Bed level       Functional mobility during ADLs: Maximal assistance;Cueing for safety;Rolling walker General ADL Comments: Pt very  hard to arouse today; keeping eyes closed and difficulty following commands. Mitts on hands and posey belt upon waist. Pt maxA to totalA due to drowsiness and restlessness today. Pt appeared calm to conclude session  resting in bed as soon as pt returned to supine.      Vision Patient Visual Report: Other (comment)(unable to fully test as pt's eyes remained closed for sessio) Vision Assessment?: Vision impaired- to be further tested in functional context Additional Comments: continue to assess as pt is more alert     Perception     Praxis      Pertinent Vitals/Pain Pain Assessment: Faces Faces Pain Scale: Hurts even more Pain Location: head Pain Descriptors / Indicators: Aching Pain Intervention(s): Repositioned;Monitored during session     Hand Dominance Right   Extremity/Trunk Assessment Upper Extremity Assessment Upper Extremity Assessment: Generalized weakness;RUE deficits/detail;LUE deficits/detail RUE Deficits / Details: In mitts; ROM, WFLs; 2/5 strength, 3+/5 grip strength  RUE Coordination: decreased gross motor LUE Deficits / Details: In mitts; ROM, WFLs; 2/5 strength, 3+/5 grip strength  LUE Coordination: decreased gross motor   Lower Extremity Assessment Lower Extremity Assessment: Generalized weakness   Cervical / Trunk Assessment Cervical / Trunk Assessment: Other exceptions Cervical / Trunk Exceptions: C-collar   Communication Communication Communication: No difficulties   Cognition Arousal/Alertness: Awake/alert Behavior During Therapy: Impulsive Overall Cognitive Status: Impaired/Different from baseline Area of Impairment: Orientation;Memory;Following commands;Safety/judgement;Awareness;Problem solving                 Orientation Level: Disoriented to;Time;Situation;Place   Memory: Decreased recall of precautions;Decreased short-term memory Following Commands: Follows one step commands inconsistently Safety/Judgement: Decreased awareness of safety;Decreased awareness of deficits Awareness: Intellectual Problem Solving: Slow processing;Difficulty sequencing;Decreased initiation;Requires verbal cues;Requires tactile cues General Comments: Pt very lethargic  today and following few commands ~25% of commands.   General Comments  BP: 121/73 sitting EOB 2L O2 94% O2 and HR 89 BPM; sitting EOB after exertion:105/76- unable to get BP in standing due to fatigue.    Exercises     Shoulder Instructions      Home Living Family/patient expects to be discharged to:: Private residence Living Arrangements: Spouse/significant other Available Help at Discharge: Family;Available 24 hours/day Type of Home: House Home Access: Stairs to enter [employer] of Steps: 4 Entrance Stairs-Rails: Left Home Layout: Two level;Able to live on main level with bedroom/bathroom     Bathroom Shower/Tub: Producer, television/film/video: Standard Bathroom Accessibility: No   Home Equipment: Environmental consultant - 2 wheels;Other (comment)(bed rail)          Prior Functioning/Environment Level of Independence: Needs assistance  Gait / Transfers Assistance Needed: pt ambulates independently, history of falls ADL's / Homemaking Assistance Needed: wife supervises ADLs; assist for IADL            OT Problem List: Decreased strength;Decreased range of motion;Decreased activity tolerance;Impaired balance (sitting and/or standing);Decreased coordination;Decreased safety awareness;Impaired UE functional use;Pain;Increased edema      OT Treatment/Interventions: Self-care/ADL training;Therapeutic exercise;Neuromuscular education;Energy conservation;DME and/or AE instruction;Therapeutic activities;Patient/family education;Balance training    OT Goals(Current goals can be found in the care plan section) Acute Rehab OT Goals Patient Stated Goal: To retun to prior level of function and eventually home OT Goal Formulation: With patient/family Time For Goal Achievement: 03/10/20 Potential to Achieve Goals: Good ADL Goals Pt Will Perform Grooming: with min assist;sitting Pt Will Perform Lower Body Dressing: with mod assist;sitting/lateral leans;sit to/from stand Pt Will  Transfer to Toilet: with mod assist;stand pivot transfer;bedside commode Additional  ADL Goal #1: Pt will follow 1 step commands consistently throughout ADL and mobility without cues to attend to task. Additional ADL Goal #2: Pt will increase to x5 mins of standing tolerance with minA for stability in prep for OOB ADL tasks.  OT Frequency: Min 2X/week   Barriers to D/C:            Co-evaluation              AM-PAC OT "6 Clicks" Daily Activity     Outcome Measure Help from another person eating meals?: Total Help from another person taking care of personal grooming?: A Lot Help from another person toileting, which includes using toliet, bedpan, or urinal?: Total Help from another person bathing (including washing, rinsing, drying)?: Total Help from another person to put on and taking off regular upper body clothing?: A Lot Help from another person to put on and taking off regular lower body clothing?: Total 6 Click Score: 8   End of Session Equipment Utilized During Treatment: Gait belt;Rolling walker;Oxygen Nurse Communication: Mobility status  Activity Tolerance: Patient tolerated treatment well Patient left: in bed;with call bell/phone within reach;with bed alarm set;with restraints reapplied  OT Visit Diagnosis: Unsteadiness on feet (R26.81);Muscle weakness (generalized) (M62.81);Repeated falls (R29.6);Other symptoms and signs involving cognitive function;Pain Pain - part of body: (neck)                Time: 2355-7322 OT Time Calculation (min): 38 min Charges:  OT General Charges $OT Visit: 1 Visit OT Evaluation $OT Eval Moderate Complexity: 1 Mod OT Treatments $Self Care/Home Management : 8-22 mins $Therapeutic Activity: 8-22 mins  Flora Lipps, OTR/L Acute Rehabilitation Services Pager: [phone] Office: [phone]   Jarome Trull  C 02/25/2020, 3:32 PM

## 2020-02-25 NOTE — Progress Notes (Signed)
Follow up - Trauma Critical Care  Patient Details:    Casey Moses is an 84 y.o. male.  Lines/tubes : External Urinary Catheter (Active)  Collection Container Standard drainage bag 02/25/20 0800  Securement Method Securing device (Describe) 02/25/20 0800  Site Assessment Clean;Intact 02/25/20 0800  Output (mL) 600 mL 02/25/20 0000    Microbiology/Sepsis markers: Results for orders placed or performed during the hospital encounter of 2020-03-20  Respiratory Panel by RT PCR (Flu A&B, Covid) - Nasopharyngeal Swab     Status: None   Collection Time: Mar 20, 2020  9:05 AM   Specimen: Nasopharyngeal Swab  Result Value Ref Range Status   SARS Coronavirus 2 by RT PCR NEGATIVE NEGATIVE Final    Comment: (NOTE) SARS-CoV-2 target nucleic acids are NOT DETECTED. The SARS-CoV-2 RNA is generally detectable in upper respiratoy specimens during the acute phase of infection. The lowest concentration of SARS-CoV-2 viral copies this assay can detect is 131 copies/mL. A negative result does not preclude SARS-Cov-2 infection and should not be used as the sole basis for treatment or other patient management decisions. A negative result may occur with  improper specimen collection/handling, submission of specimen other than nasopharyngeal swab, presence of viral mutation(s) within the areas targeted by this assay, and inadequate number of viral copies (<131 copies/mL). A negative result must be combined with clinical observations, patient history, and epidemiological information. The expected result is Negative. Fact Sheet for Patients:  https://www.moore.com/ Fact Sheet for Healthcare Providers:  https://www.young.biz/ This test is not yet ap proved or cleared by the [place] FDA and  has been authorized for detection and/or diagnosis of SARS-CoV-2 by FDA under an Emergency Use Authorization (EUA). This EUA will remain  in effect (meaning this test can be  used) for the duration of the COVID-19 declaration under Section 564(b)(1) of the Act, 21 U.S.C. section 360bbb-3(b)(1), unless the authorization is terminated or revoked sooner.    Influenza A by PCR NEGATIVE NEGATIVE Final   Influenza B by PCR NEGATIVE NEGATIVE Final    Comment: (NOTE) The Xpert Xpress SARS-CoV-2/FLU/RSV assay is intended as an aid in  the diagnosis of influenza from Nasopharyngeal swab specimens and  should not be used as a sole basis for treatment. Nasal washings and  aspirates are unacceptable for Xpert Xpress SARS-CoV-2/FLU/RSV  testing. Fact Sheet for Patients: https://www.moore.com/ Fact Sheet for Healthcare Providers: https://www.young.biz/ This test is not yet approved or cleared by the [place] FDA and  has been authorized for detection and/or diagnosis of SARS-CoV-2 by  FDA under an Emergency Use Authorization (EUA). This EUA will remain  in effect (meaning this test can be used) for the duration of the  Covid-19 declaration under Section 564(b)(1) of the Act, 21  U.S.C. section 360bbb-3(b)(1), unless the authorization is  terminated or revoked. Performed at [facility], [address] [place], [place] [address]   MRSA PCR Screening     Status: None   Collection Time: Mar 20, 2020  5:27 PM   Specimen: Nasal Mucosa; Nasopharyngeal  Result Value Ref Range Status   MRSA by PCR NEGATIVE NEGATIVE Final    Comment:        The GeneXpert MRSA Assay (FDA approved for NASAL specimens only), is one component of a comprehensive MRSA colonization surveillance program. It is not intended to diagnose MRSA infection nor to guide or monitor treatment for MRSA infections. Performed at [facility], [address] [place], [place] [address]     Anti-infectives:  Anti-infectives (From admission, onward)   None      Best Practice/Protocols:  VTE Prophylaxis: Mechanical - chemical dvt prophylaxis  once cleared for this by nsgy   Subjective:    Overnight Issues: No acute events reported.   Objective:  Vital signs for last 24 hours: Temp:  [97.6 F (36.4 C)-99.2 F (37.3 C)] 99.2 F (37.3 C) (04/11 0800) Pulse Rate:  [59-100] 63 (04/11 0800) Resp:  [13-23] 17 (04/11 0800) BP: (101-187)/(57-114) 131/60 (04/11 0800) SpO2:  [91 %-98 %] 98 % (04/11 0800)  Hemodynamic parameters for last 24 hours:    Intake/Output from previous day: 04/10 0701 - 04/11 0700 In: -  Out: 1050 [Urine:1050]  Intake/Output this shift: No intake/output data recorded.  Vent settings for last 24 hours:    Physical Exam:  General: Somnolent but no acute distress Neuro: Opens eyes to voice, somewhat confused and mumbled speech HEENT/Neck: no JVD Resp: clear to auscultation bilaterally CVS: rrr GI: soft nontender, nondistended Extremities: scds in place  Results for orders placed or performed during the hospital encounter of 02-Mar-2020 (from the past 24 hour(s))  MRSA PCR Screening     Status: None   Collection Time: 03/15/2020  5:27 PM   Specimen: Nasal Mucosa; Nasopharyngeal  Result Value Ref Range   MRSA by PCR NEGATIVE NEGATIVE  CBC     Status: Abnormal   Collection Time: 02/25/20  5:24 AM  Result Value Ref Range   WBC 10.8 (H) 4.0 - 10.5 K/uL   RBC 4.05 (L) 4.22 - 5.81 MIL/uL   Hemoglobin 12.0 (L) 13.0 - 17.0 g/dL   HCT 37.0 (L) 39.0 - 52.0 %   MCV 91.4 80.0 - 100.0 fL   MCH 29.6 26.0 - 34.0 pg   MCHC 32.4 30.0 - 36.0 g/dL   RDW 14.6 11.5 - 15.5 %   Platelets 239 150 - 400 K/uL   nRBC 0.0 0.0 - 0.2 %  Basic metabolic panel     Status: Abnormal   Collection Time: 02/25/20  5:24 AM  Result Value Ref Range   Sodium 145 135 - 145 mmol/L   Potassium 3.3 (L) 3.5 - 5.1 mmol/L   Chloride 108 98 - 111 mmol/L   CO2 25 22 - 32 mmol/L   Glucose, Bld 110 (H) 70 - 99 mg/dL   BUN 20 8 - 23 mg/dL   Creatinine, Ser 1.28 (H) 0.61 - 1.24 mg/dL   Calcium 9.2 8.9 - 10.3 mg/dL   GFR calc non Af  Amer 52 (L) >60 mL/min   GFR calc Af Amer >60 >60 mL/min   Anion gap 12 5 - 15    Assessment & Plan: Present on Admission: . Dens fracture ([place])  84 yo male with fall from standing  SAH, C2 dens fx type II - repeat imaging showed stable findings on head. Dr. Marcello Moores consulted yesterday. Management and disposition per neurosurgery Speech consulted for diet/swallow, TBI therapies PPx: SCDs, chemical dvt ppx once cleared by neurosurgery   LOS: 1 day   Additional comments:I reviewed the patient's new clinical lab test results. cbc, chemistry  Critical Care Total Time*: 32 minutes  Sharon Mt. Dema Severin, M.D. [facility]  02/25/2020  *Care during the described time interval was provided by me. I have reviewed this patient's available data, including medical history, events of note, physical examination and test results as part of my evaluation.

## 2020-02-25 NOTE — Progress Notes (Signed)
All oral meds held, pt drowsy and has new orders for swallow eval.

## 2020-02-26 ENCOUNTER — Inpatient Hospital Stay (HOSPITAL_COMMUNITY): Payer: Medicare Other

## 2020-02-26 DIAGNOSIS — E43 Unspecified severe protein-calorie malnutrition: Secondary | ICD-10-CM | POA: Insufficient documentation

## 2020-02-26 LAB — BASIC METABOLIC PANEL
Anion gap: 15 (ref 5–15)
BUN: 24 mg/dL — ABNORMAL HIGH (ref 8–23)
CO2: 24 mmol/L (ref 22–32)
Calcium: 9.6 mg/dL (ref 8.9–10.3)
Chloride: 109 mmol/L (ref 98–111)
Creatinine, Ser: 1.25 mg/dL — ABNORMAL HIGH (ref 0.61–1.24)
GFR calc Af Amer: >60 mL/min (ref >60)
GFR calc non Af Amer: 54 mL/min — ABNORMAL LOW (ref >60)
Glucose, Bld: 117 mg/dL — ABNORMAL HIGH (ref 70–99)
Potassium: 3.3 mmol/L — ABNORMAL LOW (ref 3.5–5.1)
Sodium: 148 mmol/L — ABNORMAL HIGH (ref 135–145)

## 2020-02-26 LAB — CBC WITH DIFFERENTIAL/PLATELET
Abs Immature Granulocytes: 0.05 10*3/uL (ref 0.00–0.07)
Basophils Absolute: 0.1 10*3/uL (ref 0.0–0.1)
Basophils Relative: 0 %
Eosinophils Absolute: 0.1 10*3/uL (ref 0.0–0.5)
Eosinophils Relative: 1 %
HCT: 39.2 % (ref 39.0–52.0)
Hemoglobin: 12.5 g/dL — ABNORMAL LOW (ref 13.0–17.0)
Immature Granulocytes: 0 %
Lymphocytes Relative: 7 %
Lymphs Abs: 0.9 10*3/uL (ref 0.7–4.0)
MCH: 29.2 pg (ref 26.0–34.0)
MCHC: 31.9 g/dL (ref 30.0–36.0)
MCV: 91.6 fL (ref 80.0–100.0)
Monocytes Absolute: 1.3 10*3/uL — ABNORMAL HIGH (ref 0.1–1.0)
Monocytes Relative: 10 %
Neutro Abs: 10.3 10*3/uL — ABNORMAL HIGH (ref 1.7–7.7)
Neutrophils Relative %: 82 %
Platelets: 240 10*3/uL (ref 150–400)
RBC: 4.28 MIL/uL (ref 4.22–5.81)
RDW: 14.7 % (ref 11.5–15.5)
WBC: 12.6 10*3/uL — ABNORMAL HIGH (ref 4.0–10.5)
nRBC: 0 % (ref 0.0–0.2)

## 2020-02-26 LAB — MAGNESIUM
Magnesium: 1.6 mg/dL — ABNORMAL LOW (ref 1.7–2.4)
Magnesium: 1.6 mg/dL — ABNORMAL LOW (ref 1.7–2.4)

## 2020-02-26 LAB — GLUCOSE, CAPILLARY
Glucose-Capillary: 133 mg/dL — ABNORMAL HIGH (ref 70–99)
Glucose-Capillary: 117 mg/dL — ABNORMAL HIGH (ref 70–99)
Glucose-Capillary: 108 mg/dL — ABNORMAL HIGH (ref 70–99)

## 2020-02-26 LAB — PHOSPHORUS
Phosphorus: 3.4 mg/dL (ref 2.5–4.6)
Phosphorus: 3.1 mg/dL (ref 2.5–4.6)

## 2020-02-26 MED ORDER — DOCUSATE SODIUM 50 MG/5ML PO LIQD
100.0000 mg | Freq: Two times a day (BID) | ORAL | Status: DC
  Administered 2020-02-26 – 2020-03-01 (×9): 100 mg
  Filled 2020-02-26 (×9): qty 10

## 2020-02-26 MED ORDER — SERTRALINE HCL 100 MG PO TABS
100.0000 mg | ORAL_TABLET | Freq: Every day | ORAL | Status: DC
  Administered 2020-02-27 – 2020-03-01 (×4): 100 mg
  Filled 2020-02-26 (×4): qty 1

## 2020-02-26 MED ORDER — DONEPEZIL HCL 10 MG PO TABS: 10.0000 mg | ORAL_TABLET | Freq: Every day | ORAL | Status: DC

## 2020-02-26 MED ORDER — CARBIDOPA-LEVODOPA 10-100 MG PO TABS: 1.0000 | ORAL_TABLET | Freq: Three times a day (TID) | ORAL | Status: DC

## 2020-02-26 MED ORDER — HYDRALAZINE HCL 10 MG PO TABS: 10.0000 mg | ORAL_TABLET | Freq: Four times a day (QID) | ORAL | Status: DC

## 2020-02-26 MED ORDER — POTASSIUM CHLORIDE IN NACL 20-0.9 MEQ/L-% IV SOLN
INTRAVENOUS | Status: DC
  Filled 2020-02-26 (×2): qty 1000

## 2020-02-26 MED ORDER — AMLODIPINE BESYLATE 5 MG PO TABS: 5.0000 mg | ORAL_TABLET | Freq: Every day | ORAL | Status: DC

## 2020-02-26 MED ORDER — CHLORHEXIDINE GLUCONATE 0.12 % MT SOLN
15.0000 mL | Freq: Two times a day (BID) | OROMUCOSAL | Status: DC
  Administered 2020-02-26 – 2020-03-01 (×10): 15 mL via OROMUCOSAL
  Filled 2020-02-26 (×8): qty 15

## 2020-02-26 MED ORDER — TRAMADOL 5 MG/ML ORAL SUSPENSION: 50.0000 mg | Freq: Four times a day (QID) | ORAL | Status: DC | PRN

## 2020-02-26 MED ORDER — POTASSIUM CHLORIDE 10 MEQ/100ML IV SOLN
10.0000 meq | INTRAVENOUS | Status: AC
  Administered 2020-02-26 (×2): 10 meq via INTRAVENOUS
  Filled 2020-02-26 (×2): qty 100

## 2020-02-26 MED ORDER — SODIUM CHLORIDE 0.9 % IV SOLN: INTRAVENOUS | Status: DC

## 2020-02-26 MED ORDER — AMLODIPINE BESYLATE 5 MG PO TABS
5.0000 mg | ORAL_TABLET | Freq: Every day | ORAL | Status: DC
  Administered 2020-02-26: 5 mg
  Filled 2020-02-26: qty 1

## 2020-02-26 MED ORDER — PRO-STAT SUGAR FREE PO LIQD
30.0000 mL | Freq: Two times a day (BID) | ORAL | Status: AC
  Administered 2020-02-26 (×2): 30 mL
  Filled 2020-02-26 (×2): qty 30

## 2020-02-26 MED ORDER — VITAL HIGH PROTEIN PO LIQD: 1000.0000 mL | ORAL | Status: DC

## 2020-02-26 MED ORDER — DONEPEZIL HCL 10 MG PO TABS
10.0000 mg | ORAL_TABLET | Freq: Two times a day (BID) | ORAL | Status: DC
  Administered 2020-02-26 – 2020-03-01 (×9): 10 mg
  Filled 2020-02-26 (×9): qty 1

## 2020-02-26 MED ORDER — PIVOT 1.5 CAL PO LIQD
1000.0000 mL | ORAL | Status: DC
  Administered 2020-02-27 – 2020-02-29 (×3): 1000 mL
  Filled 2020-02-26 (×7): qty 1000

## 2020-02-26 MED ORDER — HYDRALAZINE HCL 10 MG PO TABS
10.0000 mg | ORAL_TABLET | Freq: Four times a day (QID) | ORAL | Status: DC | PRN
  Administered 2020-02-26 – 2020-02-27 (×3): 10 mg
  Filled 2020-02-26 (×5): qty 1

## 2020-02-26 MED ORDER — ATENOLOL 25 MG PO TABS
25.0000 mg | ORAL_TABLET | Freq: Every day | ORAL | Status: DC
  Administered 2020-02-26 – 2020-03-01 (×5): 25 mg
  Filled 2020-02-26 (×5): qty 1

## 2020-02-26 MED ORDER — VITAL HIGH PROTEIN PO LIQD: 1000.0000 mL | ORAL | Status: AC

## 2020-02-26 MED ORDER — ACETAMINOPHEN 325 MG PO TABS
650.0000 mg | ORAL_TABLET | ORAL | Status: DC | PRN
  Administered 2020-02-26 – 2020-02-28 (×2): 650 mg
  Filled 2020-02-26 (×2): qty 2

## 2020-02-26 MED ORDER — VITAL HIGH PROTEIN PO LIQD
1000.0000 mL | ORAL | Status: DC
  Administered 2020-02-26: 1000 mL

## 2020-02-26 MED ORDER — CARBIDOPA-LEVODOPA 25-100 MG PO TABS
2.0000 | ORAL_TABLET | Freq: Three times a day (TID) | ORAL | Status: DC
  Administered 2020-02-26 – 2020-02-29 (×10): 2
  Filled 2020-02-26 (×12): qty 2

## 2020-02-26 MED ORDER — METOPROLOL TARTRATE 5 MG/5ML IV SOLN
5.0000 mg | Freq: Four times a day (QID) | INTRAVENOUS | Status: DC | PRN
  Administered 2020-02-26 – 2020-02-27 (×3): 5 mg via INTRAVENOUS
  Filled 2020-02-26 (×3): qty 5

## 2020-02-26 MED ORDER — ORAL CARE MOUTH RINSE
15.0000 mL | Freq: Two times a day (BID) | OROMUCOSAL | Status: DC
  Administered 2020-02-26 – 2020-03-01 (×9): 15 mL via OROMUCOSAL

## 2020-02-26 NOTE — Progress Notes (Signed)
PT Cancellation Note  Patient Details Name: Casey Moses MRN: [id number] DOB: [birth year]   Cancelled Treatment:    Reason Eval/Treat Not Completed: Patient's level of consciousness;Medical issues which prohibited therapy Per nurse, pt not ready for PT services due to decreased level of arousal and not following commands. Will follow.   Blake Divine [name] 02/26/2020, 10:31 AM [place], PT, DPT Acute Rehabilitation Services Pager [phone] Office [phone]

## 2020-02-26 NOTE — Progress Notes (Signed)
Neurosurgery   Remains sleepy.  Speech dysarthric.  C-collar in place.  Follows commands in all 4 extremities, oriented to person, hospital.     Status post fall with traumatic subarachnoid hemorrhage and type 2 dens fracture -Continue C-collar at all times -Follow up in clinic in 4 weeks with cervical spine x-ray -  Continue supportive care

## 2020-02-26 NOTE — Procedures (Signed)
Cortrak  Person Inserting Tube:  Peytin Dechert C, RD Tube Type:  Cortrak - 43 inches Tube Location:  Left nare Initial Placement:  Stomach Secured by: Bridle Technique Used to Measure Tube Placement:  Documented cm marking at nare/ corner of mouth Cortrak Secured At:  71 cm    Cortrak Tube Team Note:  Consult received to place a Cortrak feeding tube.   No x-ray is required. RN may begin using tube.   If the tube becomes dislodged please keep the tube and contact the Cortrak team at [email or web address] (password [email or web address]) for replacement.  If after hours and replacement cannot be delayed, place a NG tube and confirm placement with an abdominal x-ray.    Onaje Warne P., RD, LDN, CNSC See AMiON for contact information    

## 2020-02-26 NOTE — Progress Notes (Signed)
Inpatient Rehab Admissions:  Inpatient Rehab Consult received.  I met with patient and his wife and son at the bedside for rehabilitation assessment and to discuss goals and expectations of an inpatient rehab admission.  At this time, they are unsure whether to proceed with CIR versus SNF, given limited physical assistance available at home.  Will f/u with them in 1-2 days for a decision.   Signed: Shann Medal, PT, DPT Admissions Coordinator [phone] 02/26/20  4:42 PM

## 2020-02-26 NOTE — Evaluation (Signed)
Clinical/Bedside Swallow Evaluation Patient Details  Name: Casey Moses MRN: [id number] Date of Birth: 02/06/1936  Today's Date: 02/26/2020 Time: SLP Start Time (ACUTE ONLY): 1115 SLP Stop Time (ACUTE ONLY): 1135 SLP Time Calculation (min) (ACUTE ONLY): 20 min  Past Medical History:  Past Medical History:  Diagnosis Date  . Anxiety   . Asthma   . COPD (chronic obstructive pulmonary disease) (HCC)   . GERD (gastroesophageal reflux disease)   . Hyperlipidemia   . Hypertension   . Parkinson's disease [facility])    Past Surgical History: History reviewed. No pertinent surgical history. HPI:  Pt is an 84 yo male presenting s/p fall with bilateral SAH and C2 dens fx type II with minimal displacement. 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 PMH: dementia, Parkinson's disease, falls, COPD, HTN, HLD   Assessment / Plan / Recommendation Clinical Impression    Pt is too lethargic for PO intake this morning, arousing briefly at first to respond to a few simple yes/no questions/commands, but keeping his eyes closed and quickly falling back to sleep. His vocal quality was noted to be wet, and he was also snoring throughout most of this evaluation. Pt had a moderate amount of dried secretions throughout his oral cavity, especially adhered to his soft palate/uvula. SLP provided oral care with removal of most secretions with minimal change in alertness. Given multiple risk factors for aspiration in his medical hx (dementia, Parkinson's disease, COPD, osteophytes) now further complicated by acute changes in mentation and possible prevertebral swelling, recommend that he remain NPO with emphasis on frequent oral care. With improvements in mentation, he may be able to start some POs, although instrumental testing may still be indicated.   SLP Visit Diagnosis: Dysphagia, unspecified (R13.10)    Aspiration Risk  Severe aspiration risk    Diet Recommendation NPO;Alternative means - temporary    Medication Administration: Via alternative means    Other  Recommendations Oral Care Recommendations: Oral care QID Other Recommendations: Have oral suction available   Follow up Recommendations Inpatient Rehab      Frequency and Duration min 2x/week  2 weeks       Prognosis Prognosis for Safe Diet Advancement: Good Barriers to Reach Goals: Cognitive deficits      Swallow Study   General HPI: Pt is an 84 yo male presenting s/p fall with bilateral SAH and C2 dens fx type II with minimal displacement. 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 PMH: dementia, Parkinson's disease, falls, COPD, HTN, HLD Type of Study: Bedside Swallow Evaluation Previous Swallow Assessment: none in chart Diet Prior to this Study: Thin liquids(CLD) Temperature Spikes Noted: No Respiratory Status: Nasal cannula History of Recent Intubation: No Behavior/Cognition: Lethargic/Drowsy;Requires cueing Oral Cavity Assessment: Dry;Dried secretions Oral Care Completed by SLP: Yes Oral Cavity - Dentition: Adequate natural dentition;Poor condition Self-Feeding Abilities: Total assist Patient Positioning: Upright in bed Baseline Vocal Quality: Wet Volitional Cough: Wet    Oral/Motor/Sensory Function Overall Oral Motor/Sensory Function: (not alert enough to complete)   Ice Chips Ice chips: Not tested   Thin Liquid Thin Liquid: Not tested    Nectar Thick Nectar Thick Liquid: Not tested   Honey Thick Honey Thick Liquid: Not tested   Puree Puree: Not tested   Solid     Solid: Not tested       Mahala Menghini., M.A. CCC-SLP Acute Rehabilitation Services Pager [phone] Office [phone]  02/26/2020,11:51 AM

## 2020-02-26 NOTE — Consult Note (Signed)
Physical Medicine and Rehabilitation Consult Reason for Consult: Altered mental status Referring Physician: Trauma services   HPI: Casey Moses is a 84 y.o. right-handed male with history of COPD/tobacco abuse, hyperlipidemia, hypertension, Parkinson's disease maintained on Sinemet, dementia maintained on Aricept as well as Namenda.  Per chart review patient lives with spouse.  Two-level home bed and bath on main level 4 steps to entry.  Patient reportedly ambulates independently with multiple falls reported wife supervises ADLs.  Presented 03/01/2020 after a fall in the bathroom.  Questionable loss of consciousness.  Cranial CT scan showed multiple small foci of subarachnoid hemorrhage and parenchymal hemorrhage within the left and right cerebral hemispheres most consistent with shear force trauma.  No midline shift or mass-effect.  Large right frontal scalp hematoma.  Type II dens fracture with minimal 1 mm posterior displacement of the posterior fracture fragment.  Neurosurgery Dr. Hoyt KochJonathan Thomas follow-up conservative care no surgery indicated and maintained in a cervical brace at all times.  Admission chemistries potassium 3.3, WBC 12,200.  Hospital course bouts of agitation and restlessness with soft restraints for safety.  Therapy evaluations completed with recommendations of physical medicine rehab consult.   Review of Systems  Unable to perform ROS: Acuity of condition   Past Medical History:  Diagnosis Date  . Anxiety   . Asthma   . COPD (chronic obstructive pulmonary disease) (HCC)   . GERD (gastroesophageal reflux disease)   . Hyperlipidemia   . Hypertension   . Parkinson's disease (HCC)    History reviewed. No pertinent surgical history. No family history on file. Social History:  reports that he has quit smoking. He has never used smokeless tobacco. He reports current alcohol use. He reports that he does not use drugs. Allergies: No Known Allergies Medications Prior  to Admission  Medication Sig Dispense Refill  . ALPRAZolam (XANAX) 0.5 MG tablet Take 0.5 mg by mouth at bedtime as needed for anxiety.    Marland Kitchen. amLODipine (NORVASC) 10 MG tablet Take 10 mg by mouth daily.    Marland Kitchen. aspirin EC 81 MG tablet Take 81 mg by mouth daily.    Marland Kitchen. atenolol (TENORMIN) 25 MG tablet Take 1 tablet (25 mg total) by mouth daily. 30 tablet 0  . atorvastatin (LIPITOR) 40 MG tablet Take 40 mg by mouth daily.    . carbidopa-levodopa (SINEMET CR) 50-200 MG tablet Take 1 tablet by mouth 3 (three) times daily.    . carbidopa-levodopa (SINEMET IR) 10-100 MG tablet Take 1 tablet by mouth 3 (three) times daily.    . cephALEXin (KEFLEX) 500 MG capsule Take 1 capsule (500 mg total) by mouth 3 (three) times daily. 15 capsule 0  . donepezil (ARICEPT) 10 MG tablet Take 1 tablet by mouth in the morning and at bedtime.    . donepezil (ARICEPT) 5 MG tablet Take 5 mg by mouth at bedtime.    . Fluticasone-Salmeterol (ADVAIR) 100-50 MCG/DOSE AEPB Inhale 1 puff into the lungs 2 (two) times daily.    . memantine (NAMENDA) 10 MG tablet Take 10 mg by mouth 2 (two) times daily.    Marland Kitchen. omeprazole (PRILOSEC) 10 MG capsule Take 10 mg by mouth daily.    Marland Kitchen. omeprazole (PRILOSEC) 40 MG capsule Take 40 mg by mouth daily.    . potassium chloride SA (KLOR-CON) 20 MEQ tablet Take 20 mEq by mouth 2 (two) times daily.    . QUEtiapine (SEROQUEL) 25 MG tablet     . sertraline (ZOLOFT) 100  MG tablet Take 100 mg by mouth daily.    . sertraline (ZOLOFT) 50 MG tablet Take 50 mg by mouth daily.    . tamsulosin (FLOMAX) 0.4 MG CAPS capsule Take 0.4 mg by mouth.      Home: Home Living Family/patient expects to be discharged to:: Private residence Living Arrangements: Spouse/significant other Available Help at Discharge: Family, Available 24 hours/day Type of Home: House Home Access: Stairs to enter [employer] of Steps: 4 Entrance Stairs-Rails: Left Home Layout: Two level, Able to live on main level with  bedroom/bathroom Bathroom Shower/Tub: Health visitor: Standard Bathroom Accessibility: No Home Equipment: Environmental consultant - 2 wheels, Other (comment)(bed rail)  Functional History: Prior Function Level of Independence: Needs assistance Gait / Transfers Assistance Needed: pt ambulates independently, history of falls ADL's / Homemaking Assistance Needed: wife supervises ADLs; assist for IADL Functional Status:  Mobility: Bed Mobility Overal bed mobility: Needs Assistance Bed Mobility: Rolling, Supine to Sit, Sit to Sidelying, Sit to Supine Rolling: Mod assist Supine to sit: Mod assist Sit to supine: Max assist Sit to sidelying: Max assist General bed mobility comments: Pt requiring increased assist today for trunk elevation, BLE management and overall sequencing through task. Transfers Overall transfer level: Needs assistance Equipment used: Rolling walker (2 wheeled), None Transfers: Sit to/from Stand Sit to Stand: Max assist General transfer comment: power up with maxA; requiring bed to lean on to take steps toward HOB. Ambulation/Gait Ambulation/Gait assistance: Mod assist Gait Distance (Feet): 20 Feet Assistive device: None Gait Pattern/deviations: Staggering left, Staggering right General Gait Details: pt with slowed step to gait, multiple LOB, staggering left and right intermittently. Pt requires steadying from PT and verbal and tactile cues for direction during session Gait velocity: reduced Gait velocity interpretation: <1.8 ft/sec, indicate of risk for recurrent falls    ADL: ADL Overall ADL's : Needs assistance/impaired Eating/Feeding: NPO Grooming: Maximal assistance Grooming Details (indicate cue type and reason): hand over hand assist to wash face; pt unable to arouse fully Upper Body Bathing: Maximal assistance, Sitting Lower Body Bathing: Total assistance, Sitting/lateral leans, Sit to/from stand, Bed level Upper Body Dressing : Maximal  assistance Lower Body Dressing: Total assistance Toilet Transfer: Maximal assistance, +2 for physical assistance Toileting- Clothing Manipulation and Hygiene: Maximal assistance, Total assistance, +2 for physical assistance, +2 for safety/equipment, Sitting/lateral lean, Sit to/from stand, Bed level Functional mobility during ADLs: Maximal assistance, Cueing for safety, Rolling walker General ADL Comments: Pt very hard to arouse today; keeping eyes closed and difficulty following commands. Mitts on hands and posey belt upon waist. Pt maxA to totalA due to drowsiness and restlessness today. Pt appeared calm to conclude session resting in bed as soon as pt returned to supine.   Cognition: Cognition Overall Cognitive Status: Impaired/Different from baseline Orientation Level: Oriented to person, Disoriented to place, Disoriented to time, Disoriented to situation Cognition Arousal/Alertness: Awake/alert Behavior During Therapy: Impulsive Overall Cognitive Status: Impaired/Different from baseline Area of Impairment: Orientation, Memory, Following commands, Safety/judgement, Awareness, Problem solving Orientation Level: Disoriented to, Time, Situation, Place Memory: Decreased recall of precautions, Decreased short-term memory Following Commands: Follows one step commands inconsistently Safety/Judgement: Decreased awareness of safety, Decreased awareness of deficits Awareness: Intellectual Problem Solving: Slow processing, Difficulty sequencing, Decreased initiation, Requires verbal cues, Requires tactile cues General Comments: Pt very lethargic today and following few commands ~25% of commands.  Blood pressure (!) 131/113, pulse 82, temperature 98.4 F (36.9 C), temperature source Axillary, resp. rate (!) 25, height 5\' 10"  (1.778 m), weight 72.6 kg,  SpO2 94 %. Physical Exam  Neck:  Hard cervical collar in place  Neurological:  Patient is lethargic but arousable.  He does state that he is in  the hospital but very limited historian.  Follows simple commands with bilateral wrist restraints in place.    Results for orders placed or performed during the hospital encounter of 2020-03-03 (from the past 24 hour(s))  CBC with Differential/Platelet     Status: Abnormal   Collection Time: 02/26/20  4:23 AM  Result Value Ref Range   WBC 12.6 (H) 4.0 - 10.5 K/uL   RBC 4.28 4.22 - 5.81 MIL/uL   Hemoglobin 12.5 (L) 13.0 - 17.0 g/dL   HCT 76.2 83.1 - 51.7 %   MCV 91.6 80.0 - 100.0 fL   MCH 29.2 26.0 - 34.0 pg   MCHC 31.9 30.0 - 36.0 g/dL   RDW 61.6 07.3 - 71.0 %   Platelets 240 150 - 400 K/uL   nRBC 0.0 0.0 - 0.2 %   Neutrophils Relative % 82 %   Neutro Abs 10.3 (H) 1.7 - 7.7 K/uL   Lymphocytes Relative 7 %   Lymphs Abs 0.9 0.7 - 4.0 K/uL   Monocytes Relative 10 %   Monocytes Absolute 1.3 (H) 0.1 - 1.0 K/uL   Eosinophils Relative 1 %   Eosinophils Absolute 0.1 0.0 - 0.5 K/uL   Basophils Relative 0 %   Basophils Absolute 0.1 0.0 - 0.1 K/uL   Immature Granulocytes 0 %   Abs Immature Granulocytes 0.05 0.00 - 0.07 K/uL  Basic metabolic panel     Status: Abnormal   Collection Time: 02/26/20  4:23 AM  Result Value Ref Range   Sodium 148 (H) 135 - 145 mmol/L   Potassium 3.3 (L) 3.5 - 5.1 mmol/L   Chloride 109 98 - 111 mmol/L   CO2 24 22 - 32 mmol/L   Glucose, Bld 117 (H) 70 - 99 mg/dL   BUN 24 (H) 8 - 23 mg/dL   Creatinine, Ser 6.26 (H) 0.61 - 1.24 mg/dL   Calcium 9.6 8.9 - 94.8 mg/dL   GFR calc non Af Amer 54 (L) >60 mL/min   GFR calc Af Amer >60 >60 mL/min   Anion gap 15 5 - 15   CT HEAD WO CONTRAST  Result Date: 02/25/2020 CLINICAL DATA:  Subarachnoid hemorrhage, follow-up EXAM: CT HEAD WITHOUT CONTRAST TECHNIQUE: Contiguous axial images were obtained from the base of the skull through the vertex without intravenous contrast. COMPARISON:  2020-03-03, 2018 FINDINGS: Brain: There has been some interval redistribution of scattered sulcal subarachnoid hemorrhage, noting new small  volume layering hemorrhage within the left occipital horn. Stable possible superimposed minimal parenchymal hemorrhage, for example a punctate focus of hyperdensity along the right superior frontal gyrus (series 4, image 25). There is slightly increased prominence of hypodense extra-axial space along the right frontal convexity mom which may reflect a thin subdural hygroma. No associated mass effect. There is no new hemorrhage or significant mass effect. No new loss of gray-white differentiation. Ventricles and sulci are stable in size and configuration. Vascular: No new finding. Skull: Calvarium is unremarkable. Sinuses/Orbits: Patchy lobular mucosal thickening. No new orbital finding. Other: Persistent right scalp soft tissue swelling/hematoma. IMPRESSION: Interval redistribution of small volume scattered sulcal subarachnoid hemorrhage. Stable possible superimposed minimal parenchymal hemorrhage. Suspected thin right frontal convexity subdural hygroma. No new hemorrhage or mass effect. Electronically Signed   By: Guadlupe Spanish M.D.   On: 02/25/2020 08:38   CT Head Wo  Contrast  Result Date: 02/18/2020 CLINICAL DATA:  Fall, hematoma.  Dementia. EXAM: CT HEAD WITHOUT CONTRAST CT CERVICAL SPINE WITHOUT CONTRAST TECHNIQUE: Multidetector CT imaging of the head and cervical spine was performed following the standard protocol without intravenous contrast. Multiplanar CT image reconstructions of the cervical spine were also generated. COMPARISON:  Head CT 10/16/2017 FINDINGS: CT HEAD FINDINGS Brain: There several foci of discrete high-density hemorrhage along the cortex of the high RIGHT parietal lobe, LEFT parietal lobe and LEFT frontal lobe (image 24, 23, and 26 of series 2). Lesions are most likely subarachnoid hemorrhages. Punctate parenchymal hemorrhage in the high RIGHT frontal lobe on image 27/2). Additionally subarachnoid hemorrhage in the LEFT lateral temporal lobe (image 14/2). Findings consistent with  combination parenchymal and subarachnoid hemorrhage, small volume. There is no midline shift or mass effect. Intraventricular hemorrhage. No extra-axial fluid collections. No CT evidence of acute cortical infarction. Vascular: No hyperdense vessel or unexpected calcification. Skull: No skull fracture Sinuses/Orbits: Small amount fluid in the RIGHT maxillary sinus with mucosal wall thickening. Other: Large RIGHT frontal scalp hematoma without associated skull fracture. CT CERVICAL SPINE FINDINGS Alignment: Normal alignment of the vertebral bodies. Skull base and vertebrae: There is a horizontal fracture through the base of the dens of the C2 vertebral body. There is minimal posterior displacement of the superior fracture fragment by 1 mm (image 33/6). The body of the C2 vertebral body is intact. The C1 vertebral body is intact. The craniocervical junction is normal. No additional evidence of fracture of the lower cervical vertebral bodies. Soft tissues and spinal canal: No prevertebral fluid or swelling. No visible canal hematoma. Disc levels: Multiple as endplate spurring from C3-C7. Mild disc space narrowing and sclerosis. No acute subluxation. Upper chest: Negative. Other: None IMPRESSION: 1. Multiple small foci of subarachnoid and parenchymal hemorrhage within the LEFT and RIGHT cerebral hemispheres most consistent with shear force trauma. Majority of the hemorrhages are subarachnoid. 2. No midline shift or mass effect. 3. Large RIGHT frontal scalp hematoma 4. Type II dens fracture with minimal (1 mm) posterior displacement the superior fracture fragment. 5. Multilevel disc osteophytic disease. Critical Value/emergent results were called by telephone at the time of interpretation on 02/18/2020 at 8:24 am to provider DAN FLOYD , who verbally acknowledged these results. Electronically Signed   By: Genevive Bi M.D.   On: 02/18/2020 08:27   CT Cervical Spine Wo Contrast  Result Date: 03/15/2020 CLINICAL DATA:   Fall, hematoma.  Dementia. EXAM: CT HEAD WITHOUT CONTRAST CT CERVICAL SPINE WITHOUT CONTRAST TECHNIQUE: Multidetector CT imaging of the head and cervical spine was performed following the standard protocol without intravenous contrast. Multiplanar CT image reconstructions of the cervical spine were also generated. COMPARISON:  Head CT 10/16/2017 FINDINGS: CT HEAD FINDINGS Brain: There several foci of discrete high-density hemorrhage along the cortex of the high RIGHT parietal lobe, LEFT parietal lobe and LEFT frontal lobe (image 24, 23, and 26 of series 2). Lesions are most likely subarachnoid hemorrhages. Punctate parenchymal hemorrhage in the high RIGHT frontal lobe on image 27/2). Additionally subarachnoid hemorrhage in the LEFT lateral temporal lobe (image 14/2). Findings consistent with combination parenchymal and subarachnoid hemorrhage, small volume. There is no midline shift or mass effect. Intraventricular hemorrhage. No extra-axial fluid collections. No CT evidence of acute cortical infarction. Vascular: No hyperdense vessel or unexpected calcification. Skull: No skull fracture Sinuses/Orbits: Small amount fluid in the RIGHT maxillary sinus with mucosal wall thickening. Other: Large RIGHT frontal scalp hematoma without associated skull fracture. CT CERVICAL  SPINE FINDINGS Alignment: Normal alignment of the vertebral bodies. Skull base and vertebrae: There is a horizontal fracture through the base of the dens of the C2 vertebral body. There is minimal posterior displacement of the superior fracture fragment by 1 mm (image 33/6). The body of the C2 vertebral body is intact. The C1 vertebral body is intact. The craniocervical junction is normal. No additional evidence of fracture of the lower cervical vertebral bodies. Soft tissues and spinal canal: No prevertebral fluid or swelling. No visible canal hematoma. Disc levels: Multiple as endplate spurring from C3-C7. Mild disc space narrowing and sclerosis. No  acute subluxation. Upper chest: Negative. Other: None IMPRESSION: 1. Multiple small foci of subarachnoid and parenchymal hemorrhage within the LEFT and RIGHT cerebral hemispheres most consistent with shear force trauma. Majority of the hemorrhages are subarachnoid. 2. No midline shift or mass effect. 3. Large RIGHT frontal scalp hematoma 4. Type II dens fracture with minimal (1 mm) posterior displacement the superior fracture fragment. 5. Multilevel disc osteophytic disease. Critical Value/emergent results were called by telephone at the time of interpretation on 02/22/2020 at 8:24 am to provider DAN FLOYD , who verbally acknowledged these results. Electronically Signed   By: Genevive Bi M.D.   On: 03/11/2020 08:27   DG Knee Complete 4 Views Right  Result Date: 02/27/2020 CLINICAL DATA:  RIGHT hand and foot pain following fall. EXAM: RIGHT KNEE - COMPLETE 4+ VIEW COMPARISON:  None. FINDINGS: No fracture of the proximal tibia or distal femur. Patella is normal. No joint effusion. IMPRESSION: No fracture or dislocation.  No joint effusion Electronically Signed   By: Genevive Bi M.D.   On: 02/21/2020 10:03   DG Hand Complete Right  Result Date: 03/10/2020 CLINICAL DATA:  Right hand pain post fall EXAM: RIGHT HAND - COMPLETE 3+ VIEW COMPARISON:  None FINDINGS: First CMC degenerative changes. Degenerative changes about the distal interphalangeal joints with "cecal" appearance, associated with joint space narrowing. Mild erosion along the margin of the joint of the proximal interphalangeal joints of the second and third digits. No signs of acute fracture or dislocation. IMPRESSION: 1. No acute fracture or dislocation. 2. Findings of erosive osteoarthritis. Electronically Signed   By: Donzetta Kohut M.D.   On: 03/01/2020 10:04     Assessment/Plan: Diagnosis: TBI with C2 fx after fall in patient with Parkinson's disease 1. Does the need for close, 24 hr/day medical supervision in concert with the  patient's rehab needs make it unreasonable for this patient to be served in a less intensive setting? Yes 2. Co-Morbidities requiring supervision/potential complications: sleep, behavior, pain mgt, copd, htn 3. Due to bladder management, bowel management, safety, skin/wound care, disease management, medication administration, pain management and patient education, does the patient require 24 hr/day rehab nursing? Yes 4. Does the patient require coordinated care of a physician, rehab nurse, therapy disciplines of PT, OT, SLP to address physical and functional deficits in the context of the above medical diagnosis(es)? Yes Addressing deficits in the following areas: balance, endurance, locomotion, strength, transferring, bowel/bladder control, bathing, dressing, feeding, grooming, toileting, cognition, speech, language, swallowing and psychosocial support 5. Can the patient actively participate in an intensive therapy program of at least 3 hrs of therapy per day at least 5 days per week? Yes 6. The potential for patient to make measurable gains while on inpatient rehab is good 7. Anticipated functional outcomes upon discharge from inpatient rehab are min assist and mod assist  with PT, min assist and mod assist with OT,  min assist and mod assist with SLP. 8. Estimated rehab length of stay to reach the above functional goals is: 17-24 days. Goals may ultimately be limited by his premorbid neurological disease.  9. Anticipated discharge destination: Home 10. Overall Rehab/Functional Prognosis: good  RECOMMENDATIONS: This patient's condition is appropriate for continued rehabilitative care in the following setting: CIR Patient has agreed to participate in recommended program. N/A Note that insurance prior authorization may be required for reimbursement for recommended care.  Comment: Rehab Admissions Coordinator to follow up.  Thanks,  Meredith Staggers, MD, Mellody Drown    I have reviewed and concur  with the physician assistant's documentation above.    Lavon Paganini Angiulli, PA-C 02/26/2020

## 2020-02-26 NOTE — TOC Initial Note (Signed)
Transition of Care [facility]) - Initial/Assessment Note    Patient Details  Name: Casey Moses MRN: [id number] Date of Birth: 1935/11/21  Transition of Care [facility]) CM/SW Contact:    Casey Bodo, RN Phone Number: 02/26/2020, 5:14 PM  Clinical Narrative:   84 yo male got up to go to the bathroom and lost balance and fell. Pt found to have isolated neuro injuries of SAH and c2 dens fx type II with minimal displacement. Pt has dementia and Parkinson's at baseline with history of falling. PTA, pt resided at home with spouse; needs assistance with ADLS.  PT/OT recommending CIR, though pt currently lethargic,and likely unable to tolerate therapy demands.  Spoke with pt's son, Casey Moses:  He states that pt will likely need SNF, as he will probably need longer course of rehab, and perhaps long-term SNF.  He plans to discuss this with his mom and we will see how patient progresses in next 24-48h.  Will follow up with son regarding CIR vs SNF.                  Expected Discharge Plan: Skilled Nursing Facility Barriers to Discharge: Continued Medical Work up        Expected Discharge Plan and Services Expected Discharge Plan: [place]   Discharge Planning Services: CM Consult   Living arrangements for the past 2 months: Single Family Home                                      Prior Living Arrangements/Services Living arrangements for the past 2 months: Single Family Home Lives with:: Spouse Patient language and need for interpreter reviewed:: Yes        Need for Family Participation in Patient Care: Yes (Comment) Care giver support system in place?: Yes (comment)   Criminal Activity/Legal Involvement Pertinent to Current Situation/Hospitalization: No - Comment as needed  Activities of Daily Living Home Assistive Devices/Equipment: None ADL Screening (condition at time of admission) Patient's cognitive ability adequate to safely complete daily activities?: Yes Is  the patient deaf or have difficulty hearing?: No Does the patient have difficulty seeing, even when wearing glasses/contacts?: No Does the patient have difficulty concentrating, remembering, or making decisions?: Yes Patient able to express need for assistance with ADLs?: No Does the patient have difficulty dressing or bathing?: No Independently performs ADLs?: Yes (appropriate for developmental age) Does the patient have difficulty walking or climbing stairs?: No Weakness of Legs: None Weakness of Arms/Hands: None                   Emotional Assessment Appearance:: Appears stated age Attitude/Demeanor/Rapport: Lethargic          Admission diagnosis:  SAH (subarachnoid hemorrhage) ([place]) [I60.9] Dens fracture ([place]) [S12.110A] Closed odontoid fracture, initial encounter ([place]) [S12.100A] Intraparenchymal hematoma of brain ([place]) [S06.360A] Patient Active Problem List   Diagnosis Date Noted  . Protein-calorie malnutrition, severe 02/26/2020  . Dens fracture ([place]) 02/27/2020  . Intraparenchymal hematoma of brain ([place]) 03/12/2020   PCP:  Derrill Center., MD Pharmacy:   [facility] [phone] - [place], [address] AT [address] [address] Phone: [phone] Fax: [phone]     Social Determinants of Health (SDOH) Interventions    Readmission Risk Interventions No flowsheet data found.  Reinaldo Raddle, RN, BSN  Trauma/Neuro ICU Case Manager [phone]

## 2020-02-26 NOTE — Progress Notes (Signed)
Notified Thompson MD of continued high BP and continued lethargy. Order obtained for Hydralazine per tube 10mg  q6h PRN and CT w/o contrast.

## 2020-02-26 NOTE — Progress Notes (Signed)
Physical Therapy Treatment Patient Details Name: Casey Moses MRN: [id number] DOB: 09/21/1936 Today's Date: 02/26/2020    History of Present Illness 84 yo male got up to go to the bathroom and lost balance and fell. Pt found to have isolated neuro injuries of SAH and c2 dens fx type II with minimal displacement. Pt has dementia and Parkinson's at baseline with history of falling    PT Comments    Patient not progressing towards PT goals today. Pt very lethargic and kept eyes closed for most of session. Noted to have impaired attention, arousal, ability to follow commands and delayed processing. Mumbles a few times today but difficult to understand. Requires Max A of 2 for bed mobility and demonstrates heavy right lateral lean, difficulty determining vertical orientation. Worked on upright and finding midline. Pt with HR ranging from 48-59 bpm. Arousal slightly improved with wife's presence at end of session but not able to sustain. Will follow for appropriate disposition. If pt not able to progress or improve participation, SNF might be more appropriate.     Follow Up Recommendations  CIR;Supervision/Assistance - 24 hour(pending improvement/ability to participate)     Equipment Recommendations  3in1 (PT)    Recommendations for Other Services       Precautions / Restrictions Precautions Precautions: Fall;Other (comment) Precaution Comments: C-collar, bradycardia Required Braces or Orthoses: Cervical Brace Cervical Brace: Hard collar;At all times Restrictions Weight Bearing Restrictions: No    Mobility  Bed Mobility Overal bed mobility: Needs Assistance Bed Mobility: Rolling;Sit to Sidelying;Sidelying to Sit Rolling: Mod assist Sidelying to sit: Max assist;+2 for physical assistance;HOB elevated     Sit to sidelying: Max assist;+2 for physical assistance;HOB elevated General bed mobility comments: pt with little initiation for movement; assist with LEs and trunk. Heavy right  lateral lean.  Transfers                 General transfer comment: Deferred due to safety/arousal.  Ambulation/Gait                 Stairs             Wheelchair Mobility    Modified Rankin (Stroke Patients Only) Modified Rankin (Stroke Patients Only) Pre-Morbid Rankin Score: Slight disability Modified Rankin: Severe disability     Balance Overall balance assessment: Needs assistance Sitting-balance support: Feet supported;Single extremity supported Sitting balance-Leahy Scale: Zero Sitting balance - Comments: Pt with heavy right lateral lean; worked on trying to find midline and upright posture. Able to initiate leaning left with hand over hand cues to reach for rail and come down on elbow. Postural control: Right lateral lean(heavy)     Standing balance comment: Deferred for safety/arousal.                            Cognition Arousal/Alertness: Lethargic Behavior During Therapy: Flat affect Overall Cognitive Status: Impaired/Different from baseline Area of Impairment: Orientation;Following commands;Problem solving;Attention                 Orientation Level: Disoriented to;Time;Situation;Place Current Attention Level: Focused   Following Commands: Follows one step commands inconsistently     Problem Solving: Slow processing;Difficulty sequencing;Decreased initiation;Requires verbal cues;Requires tactile cues General Comments: Lethargic today; opens eyes minimally to command but does not sustain. Follows <25% of commands. Opens eyes at end of session with wife present briefly. Mumbles at times.      Exercises      General Comments General  comments (skin integrity, edema, etc.): HR ranged from 48-60 bpm. Supine BP 199/81, supine post activity 187/72.      Pertinent Vitals/Pain Pain Assessment: Faces Faces Pain Scale: Hurts little more Pain Location: generalized with movement Pain Descriptors / Indicators:  Grimacing;Guarding Pain Intervention(s): Repositioned;Monitored during session;Limited activity within patient's tolerance    Home Living                      Prior Function            PT Goals (current goals can now be found in the care plan section) Progress towards PT goals: Not progressing toward goals - comment(level of arousal)    Frequency    Min 4X/week      PT Plan Current plan remains appropriate    Co-evaluation              AM-PAC PT "6 Clicks" Mobility   Outcome Measure  Help needed turning from your back to your side while in a flat bed without using bedrails?: A Lot Help needed moving from lying on your back to sitting on the side of a flat bed without using bedrails?: Total Help needed moving to and from a bed to a chair (including a wheelchair)?: Total Help needed standing up from a chair using your arms (e.g., wheelchair or bedside chair)?: A Lot Help needed to walk in hospital room?: Total Help needed climbing 3-5 steps with a railing? : Total 6 Click Score: 8    End of Session Equipment Utilized During Treatment: Cervical collar Activity Tolerance: Patient limited by lethargy Patient left: in bed;with call bell/phone within reach;with family/visitor present;with bed alarm set;with restraints reapplied Nurse Communication: Mobility status PT Visit Diagnosis: Unsteadiness on feet (R26.81);Repeated falls (R29.6);History of falling (Z91.81)     Time: 1220-1240 PT Time Calculation (min) (ACUTE ONLY): 20 min  Charges:  $Therapeutic Activity: 8-22 mins                     Casey Moses, PT, DPT Acute Rehabilitation Services Pager [phone] Office [phone]       Casey Moses 02/26/2020, 12:59 PM

## 2020-02-26 NOTE — Progress Notes (Addendum)
Patient ID: Casey Moses, male   DOB: 19-Oct-1936, 84 y.o.   MRN: [id number]     Subjective: Sleeping Arouses and does not offer complaint  ROS negative except as listed above. Objective: Vital signs in last 24 hours: Temp:  [97.7 F (36.5 C)-98.8 F (37.1 C)] 98.4 F (36.9 C) (04/12 0400) Pulse Rate:  [57-82] 82 (04/12 0600) Resp:  [14-31] 25 (04/12 0600) BP: (105-186)/(58-136) 131/113 (04/12 0600) SpO2:  [90 %-98 %] 94 % (04/12 0600) Last BM Date: (pTA)  Intake/Output from previous day: 04/11 0701 - 04/12 0700 In: -  Out: 1050 [Urine:1050] Intake/Output this shift: No intake/output data recorded.  General appearance: no distress Neck: collar Resp: clear to auscultation bilaterally Cardio: regular rate and rhythm GI: soft, NT Extremities: abrasion R knee Neuro: arouses and reports location as hospital, F/C to move LE  Lab Results: CBC  Recent Labs    02/25/20 0524 02/26/20 0423  WBC 10.8* 12.6*  HGB 12.0* 12.5*  HCT 37.0* 39.2  PLT 239 240   BMET Recent Labs    02/25/20 0524 02/26/20 0423  NA 145 148*  K 3.3* 3.3*  CL 108 109  CO2 25 24  GLUCOSE 110* 117*  BUN 20 24*  CREATININE 1.28* 1.25*  CALCIUM 9.2 9.6   PT/INR No results for input(s): LABPROT, INR in the last 72 hours. ABG No results for input(s): PHART, HCO3 in the last 72 hours.  Invalid input(s): PCO2, PO2  Studies/Results: CT HEAD WO CONTRAST  Result Date: 02/25/2020 CLINICAL DATA:  Subarachnoid hemorrhage, follow-up EXAM: CT HEAD WITHOUT CONTRAST TECHNIQUE: Contiguous axial images were obtained from the base of the skull through the vertex without intravenous contrast. COMPARISON:  Mar 07, 2020, 2018 FINDINGS: Brain: There has been some interval redistribution of scattered sulcal subarachnoid hemorrhage, noting new small volume layering hemorrhage within the left occipital horn. Stable possible superimposed minimal parenchymal hemorrhage, for example a punctate focus of hyperdensity  along the right superior frontal gyrus (series 4, image 25). There is slightly increased prominence of hypodense extra-axial space along the right frontal convexity mom which may reflect a thin subdural hygroma. No associated mass effect. There is no new hemorrhage or significant mass effect. No new loss of gray-white differentiation. Ventricles and sulci are stable in size and configuration. Vascular: No new finding. Skull: Calvarium is unremarkable. Sinuses/Orbits: Patchy lobular mucosal thickening. No new orbital finding. Other: Persistent right scalp soft tissue swelling/hematoma. IMPRESSION: Interval redistribution of small volume scattered sulcal subarachnoid hemorrhage. Stable possible superimposed minimal parenchymal hemorrhage. Suspected thin right frontal convexity subdural hygroma. No new hemorrhage or mass effect. Electronically Signed   By: Guadlupe Spanish M.D.   On: 02/25/2020 08:38   DG Knee Complete 4 Views Right  Result Date: 03-07-2020 CLINICAL DATA:  RIGHT hand and foot pain following fall. EXAM: RIGHT KNEE - COMPLETE 4+ VIEW COMPARISON:  None. FINDINGS: No fracture of the proximal tibia or distal femur. Patella is normal. No joint effusion. IMPRESSION: No fracture or dislocation.  No joint effusion Electronically Signed   By: Genevive Bi M.D.   On: 03-07-2020 10:03   DG Hand Complete Right  Result Date: 03/07/20 CLINICAL DATA:  Right hand pain post fall EXAM: RIGHT HAND - COMPLETE 3+ VIEW COMPARISON:  None FINDINGS: First CMC degenerative changes. Degenerative changes about the distal interphalangeal joints with "cecal" appearance, associated with joint space narrowing. Mild erosion along the margin of the joint of the proximal interphalangeal joints of the second and third digits. No signs of  acute fracture or dislocation. IMPRESSION: 1. No acute fracture or dislocation. 2. Findings of erosive osteoarthritis. Electronically Signed   By: Zetta Bills M.D.   On: 03/17/20 10:04     Anti-infectives: Anti-infectives (From admission, onward)   None      Assessment/Plan: GLF  SAH - TBI team therapies, hold ASA per Dr. Marcello Moores C2 dens fx type II - collar per Dr. Marcello Moores Parkinson's HTN FEN - speech therapy, replete hypokalemia, start IVF and TF PPx: SCDs, chemical dvt ppx once cleared by neurosurgery Dispo - 3W progressive, therapies so far rec CIR Place Cortrak to allow home meds and TF ST eval  LOS: 2 days    Georganna Skeans, MD, MPH, FACS Trauma & General Surgery Use [email or web address] to contact on call provider  02/26/2020

## 2020-02-26 NOTE — Progress Notes (Signed)
Initial Nutrition Assessment  DOCUMENTATION CODES:   Severe malnutrition in context of chronic illness  INTERVENTION:   Transition to Pivot 1.5 @ 55 ml/hr (1320 ml/day) via Cortrak tube 4/13 @ 8 am  Provides: 1980 kcal, 123 grams protein, and 1001 ml free water.   Monitor magnesium and phosphorus every 12 hours x 4 occurences, MD to replete as needed, as pt is at risk for refeeding syndrome given severe malnutriton.   NUTRITION DIAGNOSIS:   Severe Malnutrition related to chronic illness(COPD/Parkinson's) as evidenced by severe muscle depletion, severe fat depletion.  GOAL:   Patient will meet greater than or equal to 90% of their needs  MONITOR:   TF tolerance, Labs, Weight trends  REASON FOR ASSESSMENT:   Consult Enteral/tube feeding initiation and management  ASSESSMENT:   Pt with PMH of Parkinson's, COPD, HLD, GERD, anxiety, and HTN admitted after ground level fall with traumatic SAH and type 2 dens fx in c-collar.    Pt unable to answer questions at this time.  Pt admitted from home.   4/12 cortrak placed, tip gastric   Medications reviewed and include: colace NS with 20 mEq/L @ 50 ml/hr 10 mEq x 2 IV Labs reviewed: Na 148 (H), K+ 3.3 (L)  TF: Vital High Protein @ 40 ml/hr with 30 ml Prostat BID Provides: 1160 kcal and 114 grams protein   NUTRITION - FOCUSED PHYSICAL EXAM:    Most Recent Value  Orbital Region  Severe depletion  Upper Arm Region  Severe depletion  Thoracic and Lumbar Region  Severe depletion  Buccal Region  Severe depletion  Temple Region  Moderate depletion  Clavicle Bone Region  Severe depletion  Clavicle and Acromion Bone Region  Severe depletion  Scapular Bone Region  Severe depletion  Dorsal Hand  Unable to assess  Patellar Region  Severe depletion  Anterior Thigh Region  Severe depletion  Posterior Calf Region  Severe depletion  Edema (RD Assessment)  None  Hair  Reviewed  Eyes  Unable to assess  Mouth  Reviewed [scabbed  lips]  Skin  Reviewed [dry]  Nails  Unable to assess       Diet Order:   Diet Order            Diet clear liquid Room service appropriate? Yes with Assist; Fluid consistency: Thin  Diet effective now              EDUCATION NEEDS:   No education needs have been identified at this time  Skin:  Skin Assessment: Reviewed RN Assessment  Last BM:  unknown  Height:   Ht Readings from Last 1 Encounters:  2020/03/19 5\' 10"  (1.778 m)    Weight:   Wt Readings from Last 1 Encounters:  Mar 19, 2020 72.6 kg    Ideal Body Weight:  75.4 kg  BMI:  Body mass index is 22.96 kg/m.  Estimated Nutritional Needs:   Kcal:  1900-2100  Protein:  110-125 grams  Fluid:  2 L/day  04/25/20., RD, LDN, CNSC See AMiON for contact information

## 2020-02-26 NOTE — Social Work (Signed)
CSW was unable to complete sbirt due to pt only being oriented x2. CSW will attempt to complete when more appropriate.   Jimmy Picket, Theresia Majors, [place] Clinical Social Worker [phone]

## 2020-02-27 LAB — CBC
HCT: 41 % (ref 39.0–52.0)
Hemoglobin: 12.8 g/dL — ABNORMAL LOW (ref 13.0–17.0)
MCH: 28.9 pg (ref 26.0–34.0)
MCHC: 31.2 g/dL (ref 30.0–36.0)
MCV: 92.6 fL (ref 80.0–100.0)
Platelets: 260 10*3/uL (ref 150–400)
RBC: 4.43 MIL/uL (ref 4.22–5.81)
RDW: 15 % (ref 11.5–15.5)
WBC: 18.2 10*3/uL — ABNORMAL HIGH (ref 4.0–10.5)
nRBC: 0 % (ref 0.0–0.2)

## 2020-02-27 LAB — BASIC METABOLIC PANEL
Anion gap: 12 (ref 5–15)
BUN: 36 mg/dL — ABNORMAL HIGH (ref 8–23)
CO2: 26 mmol/L (ref 22–32)
Calcium: 9.9 mg/dL (ref 8.9–10.3)
Chloride: 115 mmol/L — ABNORMAL HIGH (ref 98–111)
Creatinine, Ser: 1.05 mg/dL (ref 0.61–1.24)
GFR calc Af Amer: >60 mL/min (ref >60)
GFR calc non Af Amer: >60 mL/min (ref >60)
Glucose, Bld: 133 mg/dL — ABNORMAL HIGH (ref 70–99)
Potassium: 3 mmol/L — ABNORMAL LOW (ref 3.5–5.1)
Sodium: 153 mmol/L — ABNORMAL HIGH (ref 135–145)

## 2020-02-27 LAB — MAGNESIUM
Magnesium: 1.6 mg/dL — ABNORMAL LOW (ref 1.7–2.4)
Magnesium: 2.4 mg/dL (ref 1.7–2.4)

## 2020-02-27 LAB — GLUCOSE, CAPILLARY
Glucose-Capillary: 123 mg/dL — ABNORMAL HIGH (ref 70–99)
Glucose-Capillary: 129 mg/dL — ABNORMAL HIGH (ref 70–99)
Glucose-Capillary: 136 mg/dL — ABNORMAL HIGH (ref 70–99)
Glucose-Capillary: 145 mg/dL — ABNORMAL HIGH (ref 70–99)
Glucose-Capillary: 154 mg/dL — ABNORMAL HIGH (ref 70–99)
Glucose-Capillary: 175 mg/dL — ABNORMAL HIGH (ref 70–99)

## 2020-02-27 LAB — PHOSPHORUS
Phosphorus: 2.8 mg/dL (ref 2.5–4.6)
Phosphorus: 1.8 mg/dL — ABNORMAL LOW (ref 2.5–4.6)

## 2020-02-27 MED ORDER — SODIUM CHLORIDE 0.45 % IV SOLN: INTRAVENOUS | Status: DC

## 2020-02-27 MED ORDER — MAGNESIUM SULFATE 2 GM/50ML IV SOLN
2.0000 g | Freq: Once | INTRAVENOUS | Status: AC
  Administered 2020-02-27: 2 g via INTRAVENOUS
  Filled 2020-02-27: qty 50

## 2020-02-27 MED ORDER — FREE WATER
200.0000 mL | Freq: Four times a day (QID) | Status: DC
  Administered 2020-02-27 – 2020-02-28 (×4): 200 mL

## 2020-02-27 MED ORDER — AMLODIPINE BESYLATE 5 MG PO TABS
5.0000 mg | ORAL_TABLET | Freq: Two times a day (BID) | ORAL | Status: DC
  Administered 2020-02-27 – 2020-02-28 (×3): 5 mg
  Filled 2020-02-27 (×3): qty 1

## 2020-02-27 MED ORDER — POTASSIUM CHLORIDE 10 MEQ/100ML IV SOLN
10.0000 meq | INTRAVENOUS | Status: AC
  Administered 2020-02-27 (×4): 10 meq via INTRAVENOUS
  Filled 2020-02-27 (×4): qty 100

## 2020-02-27 MED ORDER — POTASSIUM CHLORIDE 20 MEQ PO PACK
40.0000 meq | PACK | Freq: Once | ORAL | Status: AC
  Administered 2020-02-27: 40 meq
  Filled 2020-02-27: qty 2

## 2020-02-27 NOTE — Evaluation (Signed)
Speech Language Pathology Evaluation Patient Details Name: Casey Moses MRN: [id number] DOB: [birth year] Today's Date: 02/27/2020 Time: 0915-0950 SLP Time Calculation (min) (ACUTE ONLY): 35 min  Problem List:  Patient Active Problem List   Diagnosis Date Noted  . Protein-calorie malnutrition, severe 02/26/2020  . Dens fracture (H[facility]) 03/06/2020  . Intraparenchymal hematoma of brain ([facility]) 03/07/2020   Past Medical History:  Past Medical History:  Diagnosis Date  . Anxiety   . Asthma   . COPD (chronic obstructive pulmonary disease) ([facility])   . GERD (gastroesophageal reflux disease)   . Hyperlipidemia   . Hypertension   . Parkinson's disease [facility])    Past Surgical History: History reviewed. No pertinent surgical history. HPI:  Pt is an 84 yo male presenting s/p fall with bilateral SAH and C2 dens fx type II with minimal displacement. 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 PMH: dementia, Parkinson's disease, falls, COPD, HTN, HLD   Assessment / Plan / Recommendation Clinical Impression   Pt presents with significant impairments in initiation, attention, and comprehension, needing Total A for command following and not responding to SLP questions, even when simplified to yes/no format. A few spontaneous words were produced throughout the evaluation, but his speech is dysarthric (question impact from baseline Parkinson's disease) and unintelligible at the word level. Evaluation was done in conjunction with OT to increase alertness and engagement in functional tasks, but he became the most engaged when listening to music Marcelyn Ditty). His eyes were closed throughout most other functional activities. Pt will benefit from ongoing SLP services to maximize cognition and safety.    SLP Assessment  SLP Recommendation/Assessment: Patient needs continued Speech Lanaguage Pathology Services SLP Visit Diagnosis: Cognitive communication deficit (R41.841)    Follow Up Recommendations  Inpatient Rehab    Frequency and Duration min 2x/week  2 weeks      SLP Evaluation Cognition  Overall Cognitive Status: Impaired/Different from baseline Arousal/Alertness: Lethargic Orientation Level: Other (comment)(does not answer any orientation questions) Attention: Focused Focused Attention: Impaired Focused Attention Impairment: Functional basic;Verbal basic Problem Solving: Impaired Problem Solving Impairment: Functional basic Executive Function: Initiating Initiating: Impaired Initiating Impairment: Verbal basic;Functional basic Safety/Judgment: Impaired       Comprehension  Auditory Comprehension Overall Auditory Comprehension: Impaired Yes/No Questions: Impaired Basic Biographical Questions: 0-25% accurate Commands: Impaired One Step Basic Commands: 0-24% accurate    Expression Expression Primary Mode of Expression: Verbal Verbal Expression Overall Verbal Expression: Impaired Initiation: Impaired(aww clinical impressions) Non-Verbal Means of Communication: Not applicable   Oral / Motor  Motor Speech Overall Motor Speech: Impaired Phonation: Normal Resonance: Within functional limits Articulation: Impaired Level of Impairment: Word Intelligibility: Intelligibility reduced Word: 0-24% accurate   GO                     Mahala Menghini., M.A. CCC-SLP Acute Rehabilitation Services Pager [phone] Office [phone]  02/27/2020, 10:17 AM

## 2020-02-27 NOTE — Plan of Care (Signed)
  Problem: Clinical Measurements: Goal: Ability to maintain clinical measurements within normal limits will improve 02/27/2020 1132 by Elba Barman, RN Outcome: Progressing 02/27/2020 1131 by Elba Barman, RN Outcome: Progressing 02/27/2020 1130 by Elba Barman, RN Outcome: Progressing Goal: Will remain free from infection 02/27/2020 1132 by Elba Barman, RN Outcome: Progressing 02/27/2020 1131 by Elba Barman, RN Outcome: Progressing 02/27/2020 1130 by Elba Barman, RN Outcome: Progressing Goal: Diagnostic test results will improve 02/27/2020 1132 by Elba Barman, RN Outcome: Progressing 02/27/2020 1131 by Elba Barman, RN Outcome: Progressing 02/27/2020 1130 by Elba Barman, RN Outcome: Progressing Goal: Respiratory complications will improve 02/27/2020 1132 by Elba Barman, RN Outcome: Progressing 02/27/2020 1131 by Elba Barman, RN Outcome: Progressing 02/27/2020 1130 by Elba Barman, RN Outcome: Progressing Goal: Cardiovascular complication will be avoided 02/27/2020 1132 by Elba Barman, RN Outcome: Progressing 02/27/2020 1131 by Elba Barman, RN Outcome: Progressing 02/27/2020 1130 by Elba Barman, RN Outcome: Progressing

## 2020-02-27 NOTE — Progress Notes (Signed)
Subjective: CC: Tired. Working with therapies.   Objective: Vital signs in last 24 hours: Temp:  [97.7 F (36.5 C)-98.5 F (36.9 C)] 97.7 F (36.5 C) (04/13 0734) Pulse Rate:  [54-77] 73 (04/13 0734) Resp:  [13-27] 19 (04/13 0734) BP: (142-223)/(61-102) 168/91 (04/13 0734) SpO2:  [90 %-97 %] 94 % (04/13 0734) Weight:  [61.6 kg] 61.6 kg (04/13 0500) Last BM Date: (PTA)  Intake/Output from previous day: 04/12 0701 - 04/13 0700 In: 1662.4 [I.V.:1005.7; NG/GT:456.7; IV Piggyback:200] Out: 1150 [Urine:1150] Intake/Output this shift: Total I/O In: -  Out: 150 [Urine:150]  PE: General appearance: no distress Neck: collar Resp: clear to auscultation bilaterally Cardio: regular rate and rhythm GI: soft, NT Extremities: abrasion R knee Neuro: Sleepy, working with therapies   Lab Results:  Recent Labs    02/26/20 0423 02/27/20 0602  WBC 12.6* 18.2*  HGB 12.5* 12.8*  HCT 39.2 41.0  PLT 240 260   BMET Recent Labs    02/26/20 0423 02/27/20 0602  NA 148* 153*  K 3.3* 3.0*  CL 109 115*  CO2 24 26  GLUCOSE 117* 133*  BUN 24* 36*  CREATININE 1.25* 1.05  CALCIUM 9.6 9.9   PT/INR No results for input(s): LABPROT, INR in the last 72 hours. CMP     Component Value Date/Time   NA 153 (H) 02/27/2020 0602   K 3.0 (L) 02/27/2020 0602   CL 115 (H) 02/27/2020 0602   CO2 26 02/27/2020 0602   GLUCOSE 133 (H) 02/27/2020 0602   BUN 36 (H) 02/27/2020 0602   CREATININE 1.05 02/27/2020 0602   CALCIUM 9.9 02/27/2020 0602   PROT 6.8 10/16/2017 1150   ALBUMIN 4.2 10/16/2017 1150   AST 27 10/16/2017 1150   ALT 10 (L) 10/16/2017 1150   ALKPHOS 67 10/16/2017 1150   BILITOT 1.3 (H) 10/16/2017 1150   GFRNONAA >60 02/27/2020 0602   GFRAA >60 02/27/2020 0602   Lipase  No results found for: LIPASE     Studies/Results: CT HEAD WO CONTRAST  Result Date: 02/26/2020 CLINICAL DATA:  Altered mental status.  Follow-up hemorrhage. EXAM: CT HEAD WITHOUT CONTRAST  TECHNIQUE: Contiguous axial images were obtained from the base of the skull through the vertex without intravenous contrast. COMPARISON:  03/11/2020 and 02/25/2020 FINDINGS: Brain: Ventricles and cisterns are normal. Continued small amount of acute hemorrhage layering in the left occipital horn. The scattered areas of bilateral acute subarachnoid hemorrhage are becoming more difficult to visualize bilaterally with only minimal residual hemorrhage noted. No new areas of acute hemorrhage. No significant mass effect or midline shift. Possible small stable right convexity subdural hygroma. Remainder of the exam is unchanged. Vascular: No hyperdense vessel or unexpected calcification. Skull: Unchanged. Sinuses/Orbits: Unchanged including subtle air-fluid level over the right maxillary sinus. Other: Persistent small scalp contusion over the right frontal region. IMPRESSION: 1. Continued interval resolution of patient's scattered areas of bilateral acute direct hemorrhage. Stable subtle dependent acute hemorrhage over the dependent portion of the left occipital horn. No new areas of hemorrhage. 2.  Stable small right frontal scalp contusion. Electronically Signed   By: Elberta Fortis M.D.   On: 02/26/2020 14:58    Anti-infectives: Anti-infectives (From admission, onward)   None       Assessment/Plan GLF SAH - Repeat CT 4/12 improving. TBI team therapies, hold ASA per Dr. Maisie Fus C2 dens fx type II - collar per Dr. Maisie Fus Parkinson's - Carbidopa-Levodopa HTN - Home meds. Increase amlodipine to BID Hypokalemia -  Replete. AM labs  Hypomagnesemia - Replete. AM labs  Hypernatremia - Change from NS to 1/2 NS at 24ml/hr.  FEN - speech therapy, continue TF ID - None currently. Monitor leukocytosis. Afebrile. AM CBC. PPx: SCDs, chemical dvt ppx once cleared by neurosurgery Dispo - CIR vs SNF   LOS: 3 days    Jillyn Ledger , [facility] 02/27/2020, 9:28 AM Please see Amion for pager  number during day hours 7:00am-4:30pm

## 2020-02-27 NOTE — Progress Notes (Signed)
Physical Therapy Treatment Patient Details Name: Casey Moses MRN: [id number] DOB: 1936-03-06 Today's Date: 02/27/2020    History of Present Illness 84 yo male got up to go to the bathroom and lost balance and fell. Pt found to have isolated neuro injuries of SAH and c2 dens fx type II with minimal displacement. Pt has dementia and Parkinson's at baseline with history of falling    PT Comments    Pt in bed upon arrival of PT, difficult to arouse today, but responding intermittently to his name. The pt was able to be repositioned to sitting EOB with use of maxA of 2, and then maintained sitting EOB for 8-10 min with assist ranging from minG to maxA of 2 due to fluctuations in balance/retropulsion. At this point, the pt would not be able to tolerate the therapy schedule of CIR, and is better suited for SNF level therapies. The pt will continue to benefit from skilled PT to progress activity tolerance and progression of functional mobility.    Follow Up Recommendations  SNF;Supervision/Assistance - 24 hour     Equipment Recommendations  (defer to post acute)    Recommendations for Other Services       Precautions / Restrictions Precautions Precautions: Fall;Other (comment) Precaution Comments: C-collar, bradycardia Required Braces or Orthoses: Cervical Brace Cervical Brace: Hard collar;At all times Restrictions Weight Bearing Restrictions: No    Mobility  Bed Mobility Overal bed mobility: Needs Assistance Bed Mobility: Sit to Supine;Supine to Sit       Sit to supine: Max assist;+2 for safety/equipment;+2 for physical assistance Sit to sidelying: Max assist;+2 for physical assistance;HOB elevated General bed mobility comments: Max A to bring BLEs off EOB and then elevate trunk. Max A +2 to return to supine  Transfers Overall transfer level: (deferred due to safety)               General transfer comment: Deferred due to safety  Ambulation/Gait                  Stairs             Wheelchair Mobility    Modified Rankin (Stroke Patients Only) Modified Rankin (Stroke Patients Only) Pre-Morbid Rankin Score: Slight disability Modified Rankin: Severe disability     Balance Overall balance assessment: Needs assistance Sitting-balance support: Feet supported;Single extremity supported Sitting balance-Leahy Scale: Zero Sitting balance - Comments: pt with heavy R lateral lean and posterior lean at times, ranged from minG to maxA with retropulsion. The pt as using RUE well to support himself, but needed hand over hand assist to move BLE and reposition into functional supportive positions. Postural control: Right lateral lean;Posterior lean     Standing balance comment: Deferred for safety/arousal.                            Cognition Arousal/Alertness: Lethargic Behavior During Therapy: Flat affect Overall Cognitive Status: Impaired/Different from baseline Area of Impairment: Orientation;Following commands;Problem solving;Attention;Memory;Safety/judgement;Awareness                 Orientation Level: Disoriented to;Time;Situation;Place Current Attention Level: Focused Memory: Decreased recall of precautions;Decreased short-term memory Following Commands: Follows one step commands inconsistently;Follows one step commands with increased time Safety/Judgement: Decreased awareness of safety;Decreased awareness of deficits Awareness: Intellectual Problem Solving: Slow processing;Difficulty sequencing;Decreased initiation;Requires verbal cues;Requires tactile cues General Comments: Pt maintaining his eye closed for majority of session and would open eyes with cues but not sustain.  Pt responding to his name consistently, not to other stimuli.      Exercises      General Comments General comments (skin integrity, edema, etc.): VSS on RA      Pertinent Vitals/Pain Pain Assessment: Faces Faces Pain Scale: Hurts little  more Pain Location: generalized with movement Pain Descriptors / Indicators: Grimacing;Guarding Pain Intervention(s): Limited activity within patient's tolerance;Monitored during session;Repositioned    Home Living                      Prior Function            PT Goals (current goals can now be found in the care plan section) Acute Rehab PT Goals Patient Stated Goal: To retun to prior level of function and eventually home PT Goal Formulation: With family Time For Goal Achievement: 03/09/20 Potential to Achieve Goals: Good Progress towards PT goals: Not progressing toward goals - comment(level of arousal)    Frequency    Min 2X/week      PT Plan Discharge plan needs to be updated;Frequency needs to be updated    Co-evaluation              AM-PAC PT "6 Clicks" Mobility   Outcome Measure  Help needed turning from your back to your side while in a flat bed without using bedrails?: A Lot Help needed moving from lying on your back to sitting on the side of a flat bed without using bedrails?: Total Help needed moving to and from a bed to a chair (including a wheelchair)?: Total Help needed standing up from a chair using your arms (e.g., wheelchair or bedside chair)?: Total Help needed to walk in hospital room?: Total Help needed climbing 3-5 steps with a railing? : Total 6 Click Score: 7    End of Session Equipment Utilized During Treatment: Cervical collar Activity Tolerance: Patient limited by lethargy Patient left: in bed;with call bell/phone within reach;with bed alarm set;with restraints reapplied;with family/visitor present Nurse Communication: Mobility status PT Visit Diagnosis: Unsteadiness on feet (R26.81);Repeated falls (R29.6);History of falling (Z91.81)     Time: 5573-2202 PT Time Calculation (min) (ACUTE ONLY): 17 min  Charges:  $Therapeutic Activity: 8-22 mins                     Casey Moses, PT, DPT   Acute Rehabilitation  Department Pager #: [phone]   Casey Moses 02/27/2020, 2:41 PM

## 2020-02-27 NOTE — Progress Notes (Signed)
  Speech Language Pathology Treatment: Dysphagia  Patient Details Name: Casey Moses MRN: [id number] DOB: 03-Jun-1936 Today's Date: 02/27/2020 Time: 0915-0950 SLP Time Calculation (min) (ACUTE ONLY): 35 min  Assessment / Plan / Recommendation Clinical Impression  Pt was seen for skilled tx with OT to maximize arousal for diagnostic PO trials. Pt continues to keep his eyes mostly closed, and needs Max cues to open his mouth to get the bolus inside. Once an ice chip gets into his mouth, his mastication and propulsion efforts begin with good automaticity. Small amounts of water were attempted but spill almost entirely from his lips when he does not close them completely. Wet vocal quality and delayed coughing is noted after PO trials, concerning for reduced airway protection. Pt's reduced oral hygiene also puts him at further risk for complications of aspiration. He was not as open to oral care today given minimal improvements in alertness, biting down hard on the swab when SLP tried to pass inside his dentition toward his tongue/palate. Recommend continuing NPO but with strong emphasis on oral hygiene.    HPI HPI: Pt is an 84 yo male presenting s/p fall with bilateral SAH and C2 dens fx type II with minimal displacement. 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 PMH: dementia, Parkinson's disease, falls, COPD, HTN, HLD      SLP Plan  Continue with current plan of care       Recommendations  Diet recommendations: NPO Medication Administration: Via alternative means                Oral Care Recommendations: Oral care QID Follow up Recommendations: Inpatient Rehab SLP Visit Diagnosis: Dysphagia, unspecified (R13.10) Plan: Continue with current plan of care       GO                 Mahala Menghini., M.A. CCC-SLP Acute Rehabilitation Services Pager [phone] Office [phone]  02/27/2020, 10:06 AM

## 2020-02-27 NOTE — Progress Notes (Signed)
Occupational Therapy Treatment Patient Details Name: Casey Moses MRN: [id number] DOB: 1936/04/19 Today's Date: 02/27/2020    History of present illness 84 yo male got up to go to the bathroom and lost balance and fell. Pt found to have isolated neuro injuries of SAH and c2 dens fx type II with minimal displacement. Pt has dementia and Parkinson's at baseline with history of falling   OT comments  Performing session with SLP to address cognition and engagement. Also focusing on postural control and sitting balance. Pt requiring Max-Total hand over hand for self feeding and grooming tasks. Pt opening his eyes to cues but keeping eye closed for majority of session. Pt continues to present with decreased sitting balance requiring fluctuating Min-Max A for upright posture. Facilitating trunk rotation and BUE ROM while listening to [employer]; pt seeming to enjoy music. Update dc recommendation to SNF and will continue to follow acutely as admitted.    Follow Up Recommendations  SNF    Equipment Recommendations  Other (comment)(to be determined)    Recommendations for Other Services      Precautions / Restrictions Precautions Precautions: Fall;Other (comment) Precaution Comments: C-collar, bradycardia Required Braces or Orthoses: Cervical Brace Cervical Brace: Hard collar;At all times Restrictions Weight Bearing Restrictions: No       Mobility Bed Mobility Overal bed mobility: Needs Assistance Bed Mobility: Rolling;Sidelying to Sit;Sit to Supine Rolling: Mod assist Sidelying to sit: Max assist;HOB elevated   Sit to supine: Max assist;+2 for safety/equipment;+2 for physical assistance   General bed mobility comments: Max A to bring BLEs off EOB and then elevate trunk. Max A +2 to return to supine  Transfers                 General transfer comment: Deferred due to safety    Balance Overall balance assessment: Needs assistance Sitting-balance support: Feet  supported;Single extremity supported Sitting balance-Leahy Scale: Zero Sitting balance - Comments: Pt with heavy right lateral lean; worked on trying to find midline and upright posture. Noting pt with right lateral lean and leaning into tactiles cues on right. Providing tactile cues at left hip and scapular/shoulder area to provide weight shift to left Postural control: Right lateral lean;Posterior lean(heavy)                                 ADL either performed or assessed with clinical judgement   ADL Overall ADL's : Needs assistance/impaired Eating/Feeding: Maximal assistance;Sitting Eating/Feeding Details (indicate cue type and reason): Performing self feeding with SLP present during swallow study. Pt requiring Max hand over hand and then transitioning to total as he fatigued. With sitting, pt requiring Max tactile cues for balance and to facilitate changes in posture Grooming: Maximal assistance;Wash/dry face;Sitting Grooming Details (indicate cue type and reason): hand over hand assist to wash face. Decreased engagement                               General ADL Comments: Pt with decreased engagement and requiring Max-Total A for grooming and self feeding. Providing tactile cues at left hip and shoulder to facilitate left lateral lean and pelvic tilt. Pt attempting to push right and posteriorly. At times able to maintain sitting with Min Guard A     Vision   Vision Assessment?: Vision impaired- to be further tested in functional context   Perception  Praxis      Cognition Arousal/Alertness: Lethargic Behavior During Therapy: Flat affect Overall Cognitive Status: Impaired/Different from baseline Area of Impairment: Orientation;Following commands;Problem solving;Attention;Memory;Safety/judgement;Awareness                 Orientation Level: Disoriented to;Time;Situation;Place Current Attention Level: Focused Memory: Decreased recall of  precautions;Decreased short-term memory Following Commands: Follows one step commands inconsistently Safety/Judgement: Decreased awareness of safety;Decreased awareness of deficits Awareness: Intellectual Problem Solving: Slow processing;Difficulty sequencing;Decreased initiation;Requires verbal cues;Requires tactile cues General Comments: Pt maintaining his eye closed for majority of session and would open eyes with cues but not sustain. Pt with poor following of commands including decreased engagement in grooming as well as sitting balance. Playing Laqueta Linden and pt seeming to enjoy music; pt answering questions to state he enjoyed the music        Exercises Exercises: Other exercises Other Exercises Other Exercises: Trunk rotation left and right with Max A Other Exercises: BUE ROM to music    Shoulder Instructions       General Comments VSS on RA    Pertinent Vitals/ Pain       Pain Assessment: Faces Faces Pain Scale: Hurts little more Pain Location: generalized with movement Pain Descriptors / Indicators: Grimacing;Guarding Pain Intervention(s): Limited activity within patient's tolerance;Monitored during session;Repositioned  Home Living                                          Prior Functioning/Environment              Frequency  Min 2X/week        Progress Toward Goals  OT Goals(current goals can now be found in the care plan section)  Progress towards OT goals: Progressing toward goals  Acute Rehab OT Goals Patient Stated Goal: To retun to prior level of function and eventually home OT Goal Formulation: With patient/family Time For Goal Achievement: 03/10/20 Potential to Achieve Goals: Good ADL Goals Pt Will Perform Grooming: with min assist;sitting Pt Will Perform Lower Body Dressing: with mod assist;sitting/lateral leans;sit to/from stand Pt Will Transfer to Toilet: with mod assist;stand pivot transfer;bedside commode Additional  ADL Goal #1: Pt will follow 1 step commands consistently throughout ADL and mobility without cues to attend to task. Additional ADL Goal #2: Pt will increase to x5 mins of standing tolerance with minA for stability in prep for OOB ADL tasks.  Plan Discharge plan needs to be updated    Co-evaluation    PT/OT/SLP Co-Evaluation/Treatment: Yes Reason for Co-Treatment: Complexity of the patient's impairments (multi-system involvement);Necessary to address cognition/behavior during functional activity;For patient/therapist safety;To address functional/ADL transfers   OT goals addressed during session: ADL's and self-care SLP goals addressed during session: Swallowing;Cognition;Communication    AM-PAC OT "6 Clicks" Daily Activity     Outcome Measure   Help from another person eating meals?: Total Help from another person taking care of personal grooming?: A Lot Help from another person toileting, which includes using toliet, bedpan, or urinal?: Total Help from another person bathing (including washing, rinsing, drying)?: Total Help from another person to put on and taking off regular upper body clothing?: A Lot Help from another person to put on and taking off regular lower body clothing?: Total 6 Click Score: 8    End of Session    OT Visit Diagnosis: Unsteadiness on feet (R26.81);Muscle weakness (generalized) (M62.81);Repeated falls (R29.6);Other symptoms and signs  involving cognitive function;Pain Pain - part of body: (neck)   Activity Tolerance Patient tolerated treatment well   Patient Left in bed;with call bell/phone within reach;with bed alarm set;with restraints reapplied;with nursing/sitter in room   Nurse Communication Mobility status        Time: 0093-8182 OT Time Calculation (min): 36 min  Charges: OT General Charges $OT Visit: 1 Visit OT Treatments $Self Care/Home Management : 8-22 mins  Iren Whipp MSOT, OTR/L Acute Rehab Pager: [phone] Office:  [phone]   Theodoro Grist Cole [place] 02/27/2020, 11:23 AM

## 2020-02-27 NOTE — Progress Notes (Signed)
Subjective: NAEs o/n  Objective: Vital signs in last 24 hours: Temp:  [97.7 F (36.5 C)-99.2 F (37.3 C)] 99.2 F (37.3 C) (04/13 1127) Pulse Rate:  [54-77] 68 (04/13 1127) Resp:  [13-27] 20 (04/13 1127) BP: (142-223)/(61-102) 162/71 (04/13 1127) SpO2:  [90 %-97 %] 96 % (04/13 1127) Weight:  [61.6 kg] 61.6 kg (04/13 0500)  Intake/Output from previous day: 04/12 0701 - 04/13 0700 In: 1662.4 [I.V.:1005.7; NG/GT:456.7; IV Piggyback:200] Out: 1150 [Urine:1150] Intake/Output this shift: Total I/O In: 0  Out: 400 [Urine:400]  Sleepy.  Nonverbal but localizing briskly UEs, w/d LEs  Lab Results: Recent Labs    02/26/20 0423 02/27/20 0602  WBC 12.6* 18.2*  HGB 12.5* 12.8*  HCT 39.2 41.0  PLT 240 260   BMET Recent Labs    02/26/20 0423 02/27/20 0602  NA 148* 153*  K 3.3* 3.0*  CL 109 115*  CO2 24 26  GLUCOSE 117* 133*  BUN 24* 36*  CREATININE 1.25* 1.05  CALCIUM 9.6 9.9    Studies/Results: CT HEAD WO CONTRAST  Result Date: 02/26/2020 CLINICAL DATA:  Altered mental status.  Follow-up hemorrhage. EXAM: CT HEAD WITHOUT CONTRAST TECHNIQUE: Contiguous axial images were obtained from the base of the skull through the vertex without intravenous contrast. COMPARISON:  03/14/2020 and 02/25/2020 FINDINGS: Brain: Ventricles and cisterns are normal. Continued small amount of acute hemorrhage layering in the left occipital horn. The scattered areas of bilateral acute subarachnoid hemorrhage are becoming more difficult to visualize bilaterally with only minimal residual hemorrhage noted. No new areas of acute hemorrhage. No significant mass effect or midline shift. Possible small stable right convexity subdural hygroma. Remainder of the exam is unchanged. Vascular: No hyperdense vessel or unexpected calcification. Skull: Unchanged. Sinuses/Orbits: Unchanged including subtle air-fluid level over the right maxillary sinus. Other: Persistent small scalp contusion over the right frontal  region. IMPRESSION: 1. Continued interval resolution of patient's scattered areas of bilateral acute direct hemorrhage. Stable subtle dependent acute hemorrhage over the dependent portion of the left occipital horn. No new areas of hemorrhage. 2.  Stable small right frontal scalp contusion. Electronically Signed   By: Elberta Fortis M.D.   On: 02/26/2020 14:58    Assessment/Plan: 84 yo M with dens fracture, tSAH - more encephalopathic today.  Clinically, likely from toxic-metabolic causes and low suspicion from structural neurologic cause given stable previous CTs but can consider a repeat CT if does not improve - continue C collar   Bedelia Person 02/27/2020, 11:47 AM

## 2020-02-28 ENCOUNTER — Inpatient Hospital Stay (HOSPITAL_COMMUNITY): Payer: Medicare Other

## 2020-02-28 LAB — URINALYSIS, ROUTINE W REFLEX MICROSCOPIC
Bilirubin Urine: NEGATIVE
Glucose, UA: NEGATIVE mg/dL
Ketones, ur: NEGATIVE mg/dL
Nitrite: NEGATIVE
Protein, ur: 100 mg/dL — AB
Specific Gravity, Urine: 1.017 (ref 1.005–1.030)
WBC, UA: >50 WBC/hpf — ABNORMAL HIGH (ref 0–5)
pH: 6 (ref 5.0–8.0)

## 2020-02-28 LAB — BASIC METABOLIC PANEL
Anion gap: 14 (ref 5–15)
BUN: 47 mg/dL — ABNORMAL HIGH (ref 8–23)
CO2: 22 mmol/L (ref 22–32)
Calcium: 10.1 mg/dL (ref 8.9–10.3)
Chloride: 117 mmol/L — ABNORMAL HIGH (ref 98–111)
Creatinine, Ser: 1.23 mg/dL (ref 0.61–1.24)
GFR calc Af Amer: >60 mL/min (ref >60)
GFR calc non Af Amer: 55 mL/min — ABNORMAL LOW (ref >60)
Glucose, Bld: 142 mg/dL — ABNORMAL HIGH (ref 70–99)
Potassium: 4 mmol/L (ref 3.5–5.1)
Sodium: 153 mmol/L — ABNORMAL HIGH (ref 135–145)

## 2020-02-28 LAB — CBC
HCT: 41.4 % (ref 39.0–52.0)
Hemoglobin: 13.3 g/dL (ref 13.0–17.0)
MCH: 29.7 pg (ref 26.0–34.0)
MCHC: 32.1 g/dL (ref 30.0–36.0)
MCV: 92.4 fL (ref 80.0–100.0)
Platelets: 252 10*3/uL (ref 150–400)
RBC: 4.48 MIL/uL (ref 4.22–5.81)
RDW: 15.4 % (ref 11.5–15.5)
WBC: 22.8 10*3/uL — ABNORMAL HIGH (ref 4.0–10.5)
nRBC: 0 % (ref 0.0–0.2)

## 2020-02-28 LAB — GLUCOSE, CAPILLARY
Glucose-Capillary: 134 mg/dL — ABNORMAL HIGH (ref 70–99)
Glucose-Capillary: 126 mg/dL — ABNORMAL HIGH (ref 70–99)
Glucose-Capillary: 154 mg/dL — ABNORMAL HIGH (ref 70–99)
Glucose-Capillary: 149 mg/dL — ABNORMAL HIGH (ref 70–99)
Glucose-Capillary: 144 mg/dL — ABNORMAL HIGH (ref 70–99)

## 2020-02-28 MED ORDER — SODIUM CHLORIDE 0.9 % IV SOLN: 2.0000 g | INTRAVENOUS | Status: DC

## 2020-02-28 MED ORDER — FREE WATER
300.0000 mL | Freq: Four times a day (QID) | Status: DC
  Administered 2020-02-28 – 2020-02-29 (×4): 300 mL

## 2020-02-28 MED ORDER — SODIUM CHLORIDE 0.9 % IV SOLN
2.0000 g | INTRAVENOUS | Status: DC
  Administered 2020-02-28 – 2020-03-01 (×3): 2 g via INTRAVENOUS
  Filled 2020-02-28 (×4): qty 20

## 2020-02-28 MED ORDER — AMLODIPINE BESYLATE 10 MG PO TABS
10.0000 mg | ORAL_TABLET | Freq: Two times a day (BID) | ORAL | Status: DC
  Administered 2020-02-28 – 2020-03-01 (×4): 10 mg
  Filled 2020-02-28 (×4): qty 1

## 2020-02-28 NOTE — Progress Notes (Signed)
Inpatient Rehab Admissions Coordinator:   Note change in therapy recommendations to SNF.  Will sign off for CIR at this time.  Estill Dooms, PT, DPT Admissions Coordinator [phone] 02/28/20  9:24 AM

## 2020-02-28 NOTE — Progress Notes (Signed)
[facility] Progress Note     Subjective: Patient lying in bed, does not appear uncomfortable. Opened eyes to voice and followed some commands but no verbal response at all.   Review of Systems  Unable to perform ROS: Mental acuity     Objective: Vital signs in last 24 hours: Temp:  [98.2 F (36.8 C)-99.2 F (37.3 C)] 98.6 F (37 C) (04/14 0749) Pulse Rate:  [66-77] 77 (04/14 0749) Resp:  [17-20] 18 (04/14 0749) BP: (149-181)/(65-101) 181/89 (04/14 0749) SpO2:  [88 %-97 %] 93 % (04/14 0749) Weight:  [61.1 kg] 61.1 kg (04/14 0500) Last BM Date: (PTA)  Intake/Output from previous day: 04/13 0701 - 04/14 0700 In: 924 [NG/GT:924] Out: 600 [Urine:600] Intake/Output this shift: No intake/output data recorded.  PE: General: chronically ill appearing white male who is laying in bed in NAD HEENT: head is normocephalic, atraumatic.  Sclera are noninjected. Ears and nose without any masses or lesions.  Mouth is pink and moist Neck: collar present  Heart: regular, rate, and rhythm.  Normal s1,s2. No obvious murmurs, gallops, or rubs noted.  Palpable radial and pedal pulses bilaterally Lungs: CTAB, no wheezes, rhonchi, or rales noted.  Respiratory effort nonlabored Abd: soft, NT, ND, +BS, no masses, hernias, or organomegaly MS: extremities appear contracted, no edema in all 4 extremities Skin: warm and dry with no masses, lesions, or rashes Neuro: tremor, GCS 9 (E2V1M6) Psych: unable to assess orientation    Lab Results:  Recent Labs    02/27/20 0602 02/28/20 0448  WBC 18.2* 22.8*  HGB 12.8* 13.3  HCT 41.0 41.4  PLT 260 252   BMET Recent Labs    02/27/20 0602 02/28/20 0448  NA 153* 153*  K 3.0* 4.0  CL 115* 117*  CO2 26 22  GLUCOSE 133* 142*  BUN 36* 47*  CREATININE 1.05 1.23  CALCIUM 9.9 10.1   PT/INR No results for input(s): LABPROT, INR in the last 72 hours. CMP     Component Value Date/Time   NA 153 (H) 02/28/2020 0448   K 4.0 02/28/2020  0448   CL 117 (H) 02/28/2020 0448   CO2 22 02/28/2020 0448   GLUCOSE 142 (H) 02/28/2020 0448   BUN 47 (H) 02/28/2020 0448   CREATININE 1.23 02/28/2020 0448   CALCIUM 10.1 02/28/2020 0448   PROT 6.8 10/16/2017 1150   ALBUMIN 4.2 10/16/2017 1150   AST 27 10/16/2017 1150   ALT 10 (L) 10/16/2017 1150   ALKPHOS 67 10/16/2017 1150   BILITOT 1.3 (H) 10/16/2017 1150   GFRNONAA 55 (L) 02/28/2020 0448   GFRAA >60 02/28/2020 0448   Lipase  No results found for: LIPASE     Studies/Results: CT HEAD WO CONTRAST  Result Date: 02/26/2020 CLINICAL DATA:  Altered mental status.  Follow-up hemorrhage. EXAM: CT HEAD WITHOUT CONTRAST TECHNIQUE: Contiguous axial images were obtained from the base of the skull through the vertex without intravenous contrast. COMPARISON:  02/23/2020 and 02/25/2020 FINDINGS: Brain: Ventricles and cisterns are normal. Continued small amount of acute hemorrhage layering in the left occipital horn. The scattered areas of bilateral acute subarachnoid hemorrhage are becoming more difficult to visualize bilaterally with only minimal residual hemorrhage noted. No new areas of acute hemorrhage. No significant mass effect or midline shift. Possible small stable right convexity subdural hygroma. Remainder of the exam is unchanged. Vascular: No hyperdense vessel or unexpected calcification. Skull: Unchanged. Sinuses/Orbits: Unchanged including subtle air-fluid level over the right maxillary sinus. Other: Persistent small scalp contusion over  the right frontal region. IMPRESSION: 1. Continued interval resolution of patient's scattered areas of bilateral acute direct hemorrhage. Stable subtle dependent acute hemorrhage over the dependent portion of the left occipital horn. No new areas of hemorrhage. 2.  Stable small right frontal scalp contusion. Electronically Signed   By: Marin Olp M.D.   On: 02/26/2020 14:58    Anti-infectives: Anti-infectives (From admission, onward)   None        Assessment/Plan GLF SAH- Repeat CT 4/12 improving. TBI team therapies, hold ASA per Dr. Marcello Moores C2 dens fx type II -collar per Dr. Marcello Moores Parkinson's - Carbidopa-Levodopa HTN - Home meds. Increase amlodipine to 10 mg BID Hypokalemia -  Resolved  Hypomagnesemia - Resolved  Hypernatremia - Na remains at 153, increase free water to 300 cc q6h, AM labs  FEN - speech therapy, continue TF ID - None currently. WBC 22.8 from 18.2, afebrile, check CXR and UA VTE: SCDs, chemical dvt ppx once cleared by neurosurgery   Dispo- check CXR and UA for source of leukocytosis. Increase free water for hypernatremia and repeat labs in AM. Increase amlodipine for BP control. PT/OT recommending SNF, if patient is discharged to SNF will need PEG placement prior to discharge. If family is not open to PEG placement and patient does not improve in mental status would need to consider transition to palliative care.   LOS: 4 days    Norm Parcel , [facility] 02/28/2020, 9:01 AM Please see Amion for pager number during day hours 7:00am-4:30pm

## 2020-02-28 NOTE — Progress Notes (Signed)
Subjective: NAEs o/n  Objective: Vital signs in last 24 hours: Temp:  [98.2 F (36.8 C)-99.6 F (37.6 C)] 99.6 F (37.6 C) (04/14 1122) Pulse Rate:  [63-77] 63 (04/14 1122) Resp:  [17-19] 19 (04/14 1122) BP: (149-181)/(65-101) 164/68 (04/14 1122) SpO2:  [88 %-97 %] 94 % (04/14 1122) Weight:  [61.1 kg] 61.1 kg (04/14 0500)  Intake/Output from previous day: 04/13 0701 - 04/14 0700 In: 924 [NG/GT:924] Out: 600 [Urine:600] Intake/Output this shift: Total I/O In: -  Out: 800 [Urine:800]  Drowsy, dysarthric Follows simple commands x 4. ~4 Hz tremor noted  Lab Results: Recent Labs    02/27/20 0602 02/28/20 0448  WBC 18.2* 22.8*  HGB 12.8* 13.3  HCT 41.0 41.4  PLT 260 252   BMET Recent Labs    02/27/20 0602 02/28/20 0448  NA 153* 153*  K 3.0* 4.0  CL 115* 117*  CO2 26 22  GLUCOSE 133* 142*  BUN 36* 47*  CREATININE 1.05 1.23  CALCIUM 9.9 10.1    Studies/Results: CT HEAD WO CONTRAST  Result Date: 02/26/2020 CLINICAL DATA:  Altered mental status.  Follow-up hemorrhage. EXAM: CT HEAD WITHOUT CONTRAST TECHNIQUE: Contiguous axial images were obtained from the base of the skull through the vertex without intravenous contrast. COMPARISON:  02/22/2020 and 02/25/2020 FINDINGS: Brain: Ventricles and cisterns are normal. Continued small amount of acute hemorrhage layering in the left occipital horn. The scattered areas of bilateral acute subarachnoid hemorrhage are becoming more difficult to visualize bilaterally with only minimal residual hemorrhage noted. No new areas of acute hemorrhage. No significant mass effect or midline shift. Possible small stable right convexity subdural hygroma. Remainder of the exam is unchanged. Vascular: No hyperdense vessel or unexpected calcification. Skull: Unchanged. Sinuses/Orbits: Unchanged including subtle air-fluid level over the right maxillary sinus. Other: Persistent small scalp contusion over the right frontal region. IMPRESSION: 1.  Continued interval resolution of patient's scattered areas of bilateral acute direct hemorrhage. Stable subtle dependent acute hemorrhage over the dependent portion of the left occipital horn. No new areas of hemorrhage. 2.  Stable small right frontal scalp contusion. Electronically Signed   By: Elberta Fortis M.D.   On: 02/26/2020 14:58   DG CHEST PORT 1 VIEW  Result Date: 02/28/2020 CLINICAL DATA:  Leukocytosis EXAM: PORTABLE CHEST 1 VIEW COMPARISON:  February 08, 2018 FINDINGS: Lungs are mildly hyperexpanded. No evident edema or airspace opacity. Heart size and pulmonary vascularity are normal. No adenopathy. There is aortic atherosclerosis. Feeding tube tip is in the stomach. No bone lesions. IMPRESSION: Lungs mildly hyperexpanded without edema or airspace opacity. Cardiac silhouette normal. Aortic Atherosclerosis (ICD10-I70.0). Feeding tube tip in stomach. Electronically Signed   By: Bretta Bang III M.D.   On: 02/28/2020 10:11    Assessment/Plan: S/p fall with tSAH and type 2 dens fracture - still somnolent but nonfocal on neurologic exam - he still requires tube feeds - his WBC count is rising but he remains afebrile - continue C-collar at all times   Bedelia Person 02/28/2020, 1:08 PM

## 2020-02-29 ENCOUNTER — Inpatient Hospital Stay (HOSPITAL_COMMUNITY): Payer: Medicare Other

## 2020-02-29 ENCOUNTER — Encounter (HOSPITAL_COMMUNITY): Payer: Self-pay

## 2020-02-29 DIAGNOSIS — R4182 Altered mental status, unspecified: Secondary | ICD-10-CM

## 2020-02-29 DIAGNOSIS — G20 Parkinson's disease: Secondary | ICD-10-CM

## 2020-02-29 DIAGNOSIS — I609 Nontraumatic subarachnoid hemorrhage, unspecified: Secondary | ICD-10-CM

## 2020-02-29 LAB — URINE CULTURE

## 2020-02-29 LAB — CBC
HCT: 38.4 % — ABNORMAL LOW (ref 39.0–52.0)
Hemoglobin: 12.2 g/dL — ABNORMAL LOW (ref 13.0–17.0)
MCH: 29.1 pg (ref 26.0–34.0)
MCHC: 31.8 g/dL (ref 30.0–36.0)
MCV: 91.6 fL (ref 80.0–100.0)
Platelets: 237 10*3/uL (ref 150–400)
RBC: 4.19 MIL/uL — ABNORMAL LOW (ref 4.22–5.81)
RDW: 15.5 % (ref 11.5–15.5)
WBC: 17.4 10*3/uL — ABNORMAL HIGH (ref 4.0–10.5)
nRBC: 0 % (ref 0.0–0.2)

## 2020-02-29 LAB — GLUCOSE, CAPILLARY
Glucose-Capillary: 113 mg/dL — ABNORMAL HIGH (ref 70–99)
Glucose-Capillary: 115 mg/dL — ABNORMAL HIGH (ref 70–99)
Glucose-Capillary: 119 mg/dL — ABNORMAL HIGH (ref 70–99)
Glucose-Capillary: 122 mg/dL — ABNORMAL HIGH (ref 70–99)
Glucose-Capillary: 123 mg/dL — ABNORMAL HIGH (ref 70–99)
Glucose-Capillary: 155 mg/dL — ABNORMAL HIGH (ref 70–99)

## 2020-02-29 LAB — BASIC METABOLIC PANEL
Anion gap: 8 (ref 5–15)
BUN: 56 mg/dL — ABNORMAL HIGH (ref 8–23)
CO2: 28 mmol/L (ref 22–32)
Calcium: 9.8 mg/dL (ref 8.9–10.3)
Chloride: 119 mmol/L — ABNORMAL HIGH (ref 98–111)
Creatinine, Ser: 1.15 mg/dL (ref 0.61–1.24)
GFR calc Af Amer: >60 mL/min (ref >60)
GFR calc non Af Amer: 59 mL/min — ABNORMAL LOW (ref >60)
Glucose, Bld: 153 mg/dL — ABNORMAL HIGH (ref 70–99)
Potassium: 3.5 mmol/L (ref 3.5–5.1)
Sodium: 155 mmol/L — ABNORMAL HIGH (ref 135–145)

## 2020-02-29 LAB — CREATININE, URINE, RANDOM: Creatinine, Urine: 88.26 mg/dL

## 2020-02-29 LAB — SODIUM, URINE, RANDOM: Sodium, Ur: <10 mmol/L

## 2020-02-29 MED ORDER — CARBIDOPA-LEVODOPA ER 50-200 MG PO TBCR
1.0000 | EXTENDED_RELEASE_TABLET | Freq: Three times a day (TID) | ORAL | Status: DC
  Administered 2020-02-29: 1 via ORAL
  Filled 2020-02-29 (×3): qty 1

## 2020-02-29 MED ORDER — GUAIFENESIN 100 MG/5ML PO SOLN
5.0000 mL | Freq: Four times a day (QID) | ORAL | Status: DC
  Administered 2020-02-29 – 2020-03-01 (×5): 100 mg
  Filled 2020-02-29 (×3): qty 10

## 2020-02-29 MED ORDER — FREE WATER
300.0000 mL | Status: DC
  Administered 2020-02-29 – 2020-03-01 (×7): 300 mL

## 2020-02-29 MED ORDER — POTASSIUM CHLORIDE 20 MEQ/15ML (10%) PO SOLN
40.0000 meq | Freq: Two times a day (BID) | ORAL | Status: AC
  Administered 2020-02-29 (×2): 40 meq via ORAL
  Filled 2020-02-29 (×2): qty 30

## 2020-02-29 NOTE — Progress Notes (Addendum)
[facility] Progress Note     Subjective: Patient lying in bed. RN reports more issues last 24 hours managing patients secretions. Patient non-verbal. Wound not open eyes or follow commands during my exam.   Review of Systems  Unable to perform ROS: Mental acuity   Objective: Vital signs in last 24 hours: Temp:  [98.4 F (36.9 C)-100.7 F (38.2 C)] 99.7 F (37.6 C) (04/15 0731) Pulse Rate:  [63-76] 76 (04/15 0731) Resp:  [13-20] 20 (04/15 0731) BP: (144-179)/(68-86) 179/86 (04/15 0731) SpO2:  [92 %-98 %] 98 % (04/15 0731) Last BM Date: (PTA)  Intake/Output from previous day: 04/14 0701 - 04/15 0700 In: 1176 [NG/GT:1076; IV Piggyback:100] Out: 2300 [Urine:2300] Intake/Output this shift: Total I/O In: -  Out: 220 [Urine:220]  PE: General: chronically ill appearing white male who is laying in bed in NAD   HEENT: head is normocephalic, atraumatic.  Sclera are noninjected. Ears and nose without any masses or lesions.  Mouth is pink and moist   Neck: collar present  Heart: regular, rate, and rhythm.  Normal s1,s2. No obvious murmurs, gallops, or rubs noted.  Palpable radial and pedal pulses bilaterally Lungs: RR 20, rhonchorus upper airway sounds,  CTAB  Abd: soft, NT, ND, +BS, no masses, hernias, or organomegaly MS: extremities appear contracted, no edema in all 4 extremities Skin: warm and dry with no masses, lesions, or rashes Neuro: tremor, GCS 7 (L3Y1O1) Psych: unable to assess orientation   Lab Results:  Recent Labs    02/28/20 0448 02/29/20 0227  WBC 22.8* 17.4*  HGB 13.3 12.2*  HCT 41.4 38.4*  PLT 252 237   BMET Recent Labs    02/28/20 0448 02/29/20 0227  NA 153* 155*  K 4.0 3.5  CL 117* 119*  CO2 22 28  GLUCOSE 142* 153*  BUN 47* 56*  CREATININE 1.23 1.15  CALCIUM 10.1 9.8   PT/INR No results for input(s): LABPROT, INR in the last 72 hours. CMP     Component Value Date/Time   NA 155 (H) 02/29/2020 0227   K 3.5 02/29/2020 0227   CL 119 (H) 02/29/2020 0227   CO2 28 02/29/2020 0227   GLUCOSE 153 (H) 02/29/2020 0227   BUN 56 (H) 02/29/2020 0227   CREATININE 1.15 02/29/2020 0227   CALCIUM 9.8 02/29/2020 0227   PROT 6.8 10/16/2017 1150   ALBUMIN 4.2 10/16/2017 1150   AST 27 10/16/2017 1150   ALT 10 (L) 10/16/2017 1150   ALKPHOS 67 10/16/2017 1150   BILITOT 1.3 (H) 10/16/2017 1150   GFRNONAA 59 (L) 02/29/2020 0227   GFRAA >60 02/29/2020 0227   Lipase  No results found for: LIPASE     Studies/Results: DG CHEST PORT 1 VIEW  Result Date: 02/28/2020 CLINICAL DATA:  Leukocytosis EXAM: PORTABLE CHEST 1 VIEW COMPARISON:  February 08, 2018 FINDINGS: Lungs are mildly hyperexpanded. No evident edema or airspace opacity. Heart size and pulmonary vascularity are normal. No adenopathy. There is aortic atherosclerosis. Feeding tube tip is in the stomach. No bone lesions. IMPRESSION: Lungs mildly hyperexpanded without edema or airspace opacity. Cardiac silhouette normal. Aortic Atherosclerosis (ICD10-I70.0). Feeding tube tip in stomach. Electronically Signed   By: Bretta Bang III M.D.   On: 02/28/2020 10:11    Anti-infectives: Anti-infectives (From admission, onward)   Start     Dose/Rate Route Frequency Ordered Stop   02/28/20 1415  cefTRIAXone (ROCEPHIN) 2 g in sodium chloride 0.9 % 100 mL IVPB  Status:  Discontinued    Note  to Pharmacy: Pharmacy may adjust dosing strength / duration / interval for maximal efficacy   2 g 200 mL/hr over 30 Minutes Intravenous Every 24 hours 02/28/20 1328 02/28/20 1328   02/28/20 1415  cefTRIAXone (ROCEPHIN) 2 g in sodium chloride 0.9 % 100 mL IVPB    Note to Pharmacy: Pharmacy may adjust dosing strength / duration / interval for maximal efficacy   2 g 200 mL/hr over 30 Minutes Intravenous Every 24 hours 02/28/20 1328         Assessment/Plan GLF SAH- Repeat CT 4/12 improving. TBI team therapies, hold ASA per Dr. Marcello Moores C2 dens fx type II -collar per Dr. Marcello Moores Parkinson's -  Carbidopa-Levodopa; neurology consult per family request to assist with [place] decision making. HTN - Home meds. Increase amlodipine to 10 mg BID Hypokalemia -  Resolved  Hypomagnesemia - Resolved  Hypernatremia - Na remains at 155, increase free water to 300 cc q4h UTI - culture suggests contaminant, started IV Rocephin 4/14  FEN - speech therapy, continue TF, add guaifenesin to help thin secretions ID - WBC 17 from 22, afebrile; rocephin 4/14 >> VTE: SCDs, chemical dvt ppx once cleared by neurosurgery   Dispo- Increase free water for hypernatremia and repeat labs in AM. Neurology consult.   PT/OT recommending SNF, if patient is discharged to SNF will need PEG placement prior to discharge. Ongoing family discussions.   LOS: 5 days    [facility] 02/29/2020, 11:03 AM Please see Amion for pager number during day hours 7:00am-4:30pm

## 2020-02-29 NOTE — Consult Note (Addendum)
Neurology Consultation  Reason for Consult: Patient with Parkinson's disease with worsening of mentation Referring Physician: Trauma MD  CC: Patient with Parkinson's disease with worsening of mentation while in the hospital  History is obtained from: Chart  HPI: Casey Moses is a 84 y.o. male with history of Parkinson's disease, hypertension, hyperlipidemia and COPD.  Patient was admitted to the hospital secondary to loss of balance and fall in which he hit his head and upper neck.  Patient was found to have a C2 dens fracture type II along with contusion hemorrhage.  Over the last day he has had significant worsening of mental status.  Neurology was consulted for further evaluation.  On consultation patient is unable to give any history.  He is placed in a c-collar.  He has oral buccal tremors along with significant right arm tremor and increased tone.  He is not following any commands or responding to painful stimuli.  He is holding his eyes tightly shut.  With chart review it was noted that patient was not getting his Sinemet at the correct times in addition he was getting IR instead of CR.  I have called family members who state that he does sometimes have a tremor in the right hand but the oral tremor is not indicative of his usual tremors.  Family states that he takes his Sinemet at 7:00 AM then 12:00 afternoon then 7:00 PM.   Past Medical History:  Diagnosis Date  . Anxiety   . Asthma   . COPD (chronic obstructive pulmonary disease) (HCC)   . GERD (gastroesophageal reflux disease)   . Hyperlipidemia   . Hypertension   . Parkinson's disease (HCC)    Family History  Problem Relation Age of Onset  . Hypertension Mother   . Hypertension Father    Social History:   reports that he has quit smoking. He has never used smokeless tobacco. He reports current alcohol use. He reports that he does not use drugs.  Medications  Current Facility-Administered Medications:  .   acetaminophen (TYLENOL) tablet 650 mg, 650 mg, Per Tube, Q4H PRN, Violeta Gelinas, MD, 650 mg at 02/28/20 1650 .  amLODipine (NORVASC) tablet 10 mg, 10 mg, Per Tube, BID, Stevin, Bielinski, PA-C, 10 mg at 02/29/20 1005 .  atenolol (TENORMIN) tablet 25 mg, 25 mg, Per Tube, Daily, Violeta Gelinas, MD, 25 mg at 02/29/20 1006 .  carbidopa-levodopa (SINEMET CR) 50-200 MG per tablet controlled release 1 tablet, 1 tablet, Oral, TID, Ulice Dash, PA-C .  cefTRIAXone (ROCEPHIN) 2 g in sodium chloride 0.9 % 100 mL IVPB, 2 g, Intravenous, Q24H, Simaan, Elizabeth S, PA-C, Last Rate: 200 mL/hr at 02/29/20 1333, 2 g at 02/29/20 1333 .  chlorhexidine (PERIDEX) 0.12 % solution 15 mL, 15 mL, Mouth Rinse, BID, Violeta Gelinas, MD, 15 mL at 02/29/20 1006 .  Chlorhexidine Gluconate Cloth 2 % PADS 6 each, 6 each, Topical, Daily, Kinsinger, De Blanch, MD, 6 each at 02/29/20 1006 .  docusate (COLACE) 50 MG/5ML liquid 100 mg, 100 mg, Per Tube, BID, Violeta Gelinas, MD, 100 mg at 02/29/20 1006 .  donepezil (ARICEPT) tablet 10 mg, 10 mg, Per Tube, BID, Violeta Gelinas, MD, 10 mg at 02/29/20 1006 .  feeding supplement (PIVOT 1.5 CAL) liquid 1,000 mL, 1,000 mL, Per Tube, Continuous, Violeta Gelinas, MD, Last Rate: 55 mL/hr at 02/29/20 0604, 1,000 mL at 02/29/20 0604 .  free water 300 mL, 300 mL, Per Tube, Q4H, Simaan, Elizabeth S, PA-C, 300 mL at 02/29/20  1239 .  guaiFENesin (ROBITUSSIN) 100 MG/5ML solution 100 mg, 5 mL, Per Tube, Q6H, Simaan, Elizabeth S, PA-C, 100 mg at 02/29/20 1333 .  haloperidol lactate (HALDOL) injection 5 mg, 5 mg, Intravenous, Q6H PRN, Kinsinger, De Blanch, MD, 5 mg at 03/22/2020 2037 .  hydrALAZINE (APRESOLINE) tablet 10 mg, 10 mg, Per Tube, Q6H PRN, Violeta Gelinas, MD, 10 mg at 02/27/20 2033 .  LORazepam (ATIVAN) injection 0.5 mg, 0.5 mg, Intravenous, Q4H PRN, Kinsinger, De Blanch, MD, 0.5 mg at 02/26/20 2107 .  MEDLINE mouth rinse, 15 mL, Mouth Rinse, q12n4p, Violeta Gelinas, MD, 15 mL at 02/29/20  1240 .  metoprolol tartrate (LOPRESSOR) injection 5 mg, 5 mg, Intravenous, Q6H PRN, Violeta Gelinas, MD, 5 mg at 02/27/20 0601 .  mometasone-formoterol (DULERA) 100-5 MCG/ACT inhaler 2 puff, 2 puff, Inhalation, BID, Kinsinger, De Blanch, MD, 2 puff at 02/29/20 0826 .  morphine 2 MG/ML injection 2-4 mg, 2-4 mg, Intravenous, Q1H PRN, Kinsinger, De Blanch, MD, 2 mg at 02/26/20 2018 .  ondansetron (ZOFRAN-ODT) disintegrating tablet 4 mg, 4 mg, Oral, Q6H PRN **OR** ondansetron (ZOFRAN) injection 4 mg, 4 mg, Intravenous, Q6H PRN, Kinsinger, De Blanch, MD .  potassium chloride 20 MEQ/15ML (10%) solution 40 mEq, 40 mEq, Oral, BID, Simaan, Elizabeth S, PA-C, 40 mEq at 02/29/20 1149 .  sertraline (ZOLOFT) tablet 100 mg, 100 mg, Per Tube, Daily, Violeta Gelinas, MD, 100 mg at 02/29/20 1007 .  tamsulosin (FLOMAX) capsule 0.4 mg, 0.4 mg, Oral, Daily, Kinsinger, De Blanch, MD, 0.4 mg at 02/29/20 1007  ROS:  Unable to obtain due to altered mental status.   Exam: Current vital signs: BP (!) 146/94 (BP Location: Left Arm)   Pulse 65   Temp 98.9 F (37.2 C) (Temporal)   Resp (!) 24   Ht 5\' 10"  (1.778 m)   Wt 61.1 kg   SpO2 98%   BMI 19.33 kg/m  Vital signs in last 24 hours: Temp:  [98.4 F (36.9 C)-100.7 F (38.2 C)] 98.9 F (37.2 C) (04/15 1134) Pulse Rate:  [65-76] 65 (04/15 1134) Resp:  [13-24] 24 (04/15 1134) BP: (144-179)/(69-94) 146/94 (04/15 1134) SpO2:  [92 %-98 %] 98 % (04/15 1134)   Constitutional: Appears well-developed and well-nourished.  Eyes: No scleral injection HENT: No OP obstrucion Head: Normocephalic.  Bruise along the right temporal region Cardiovascular: Normal rate and regular rhythm.  Respiratory: Effort normal, non-labored breathing GI: Soft.  No distension. There is no tenderness.  Skin: WDI Neuro: Mental Status: Currently nonverbal.  Does not follow commands, clenches eyes tightly when attempting to evaluate, did not respond to painful stimuli. Cranial  Nerves: II: No blink to threat III,IV, VI: Staring forward, positive doll's, pupils equal, round and reactive to light VII: Facial movement is symmetric.  VIII: hearing is intact to voice X: Palat elevates symmetrically XI: Shoulder shrug is symmetric. XII: tongue is midline without atrophy or fasciculations.  Motor: Patient does not withdraw from pain his lower extremities, patient does not withdraw from pain in bilateral upper extremities.  He does have noted increased tone on the right arm with cogwheeling and lesser extent increased tone on the left arm.  I do not know any significant tone in the lower extremities bilaterally. Sensory: No significant response to noxious stimuli Deep Tendon Reflexes: 2+ and symmetric in the biceps and patellae.  Plantars: Upgoing toe on the right downgoing on the left Cerebellar: Unable to obtain  Labs I have reviewed labs in epic and the results pertinent to this consultation  are:   CBC    Component Value Date/Time   WBC 17.4 (H) 02/29/2020 0227   RBC 4.19 (L) 02/29/2020 0227   HGB 12.2 (L) 02/29/2020 0227   HCT 38.4 (L) 02/29/2020 0227   PLT 237 02/29/2020 0227   MCV 91.6 02/29/2020 0227   MCH 29.1 02/29/2020 0227   MCHC 31.8 02/29/2020 0227   RDW 15.5 02/29/2020 0227   LYMPHSABS 0.9 02/26/2020 0423   MONOABS 1.3 (H) 02/26/2020 0423   EOSABS 0.1 02/26/2020 0423   BASOSABS 0.1 02/26/2020 0423    CMP     Component Value Date/Time   NA 155 (H) 02/29/2020 0227   K 3.5 02/29/2020 0227   CL 119 (H) 02/29/2020 0227   CO2 28 02/29/2020 0227   GLUCOSE 153 (H) 02/29/2020 0227   BUN 56 (H) 02/29/2020 0227   CREATININE 1.15 02/29/2020 0227   CALCIUM 9.8 02/29/2020 0227   PROT 6.8 10/16/2017 1150   ALBUMIN 4.2 10/16/2017 1150   AST 27 10/16/2017 1150   ALT 10 (L) 10/16/2017 1150   ALKPHOS 67 10/16/2017 1150   BILITOT 1.3 (H) 10/16/2017 1150   GFRNONAA 59 (L) 02/29/2020 0227   GFRAA >60 02/29/2020 0227    Lipid Panel  No results  found for: CHOL, TRIG, HDL, CHOLHDL, VLDL, LDLCALC, LDLDIRECT   Imaging I have reviewed the images obtained:  CT-scan of the brain-    Etta Quill PA-C [facility] [phone]  M-F  (9:00 am- 5:00 PM)  02/29/2020, 3:22 PM   I have seen the patient reviewed the above note.  Assessment:  This is an unfortunate 84 year old male with Parkinson's disease presenting to the hospital secondary to fall with punctate hemorrhages secondary to contusion along with a C2 fracture.  During hospital stay patient has had worsening mentation especially noted today.  As noted above unfortunately he has not been getting the correct Sinemet of controlled release and also he has not been receiving it at the correct times.  I believe his altered mentation is most likely multifactorial including TBI and UTI.  Certainly with a history of dementia, gram-negative infections can give a fairly profound mental status change, especially when coupled with other insults.  Also I would favor changing back to his home regimen for Sinemet.  Impression: Worsening of Parkinson's symptoms secondary to medication timing Worsening of patient's mentation UTI Possible seizure activity  Recommendations: -Change Sinemet IR to CR and give during appropriate times-has already been done -EEG -Treat UTI -MRI brain  Neurology will continue to follow  Roland Rack, MD [facility] [phone]  If 7pm- 7am, please page neurology on call as listed in [place].

## 2020-02-29 NOTE — Progress Notes (Signed)
EEG completed, results pending. 

## 2020-02-29 NOTE — Progress Notes (Signed)
Subjective: NAEs o/n  Objective: Vital signs in last 24 hours: Temp:  [98.4 F (36.9 C)-100.7 F (38.2 C)] 98.9 F (37.2 C) (04/15 1134) Pulse Rate:  [65-76] 65 (04/15 1134) Resp:  [13-24] 24 (04/15 1134) BP: (144-179)/(69-94) 146/94 (04/15 1134) SpO2:  [92 %-98 %] 98 % (04/15 1134)  Intake/Output from previous day: 04/14 0701 - 04/15 0700 In: 1176 [NG/GT:1076; IV Piggyback:100] Out: 2300 [Urine:2300] Intake/Output this shift: Total I/O In: -  Out: 220 [Urine:220]  Eyes open spontaneosly, PERRL.  Follows some simple commands but nonverbal. C-collar in place  Lab Results: Recent Labs    02/28/20 0448 02/29/20 0227  WBC 22.8* 17.4*  HGB 13.3 12.2*  HCT 41.4 38.4*  PLT 252 237   BMET Recent Labs    02/28/20 0448 02/29/20 0227  NA 153* 155*  K 4.0 3.5  CL 117* 119*  CO2 22 28  GLUCOSE 142* 153*  BUN 47* 56*  CREATININE 1.23 1.15  CALCIUM 10.1 9.8    Studies/Results: DG CHEST PORT 1 VIEW  Result Date: 02/28/2020 CLINICAL DATA:  Leukocytosis EXAM: PORTABLE CHEST 1 VIEW COMPARISON:  February 08, 2018 FINDINGS: Lungs are mildly hyperexpanded. No evident edema or airspace opacity. Heart size and pulmonary vascularity are normal. No adenopathy. There is aortic atherosclerosis. Feeding tube tip is in the stomach. No bone lesions. IMPRESSION: Lungs mildly hyperexpanded without edema or airspace opacity. Cardiac silhouette normal. Aortic Atherosclerosis (ICD10-I70.0). Feeding tube tip in stomach. Electronically Signed   By: Bretta Bang III M.D.   On: 02/28/2020 10:11    Assessment/Plan: S/p fall with tSAH and type 2 dens fracture - he is more awake for me today than yesterday but given his progressive neurodegenerative disorder, it does appear that he has a long, difficult recovery in front of him.  Agree with neurology input. - cont C-collar   Bedelia Person 02/29/2020, 11:51 AM

## 2020-02-29 NOTE — Progress Notes (Signed)
Spoke with RN to coordinate good time for STAT bedside EEG pt has to go to STAT CT first then EEG. Asked RN to call when pt is available

## 2020-02-29 NOTE — Procedures (Addendum)
Patient Name: Casey Moses  MRN: [id number]  Epilepsy Attending: Charlsie Quest  Referring Physician/Provider: Felicie Morn, PA Date: 02/29/2020 Duration: 23.53 mins  Patient history: 84 year old male with Parkinson's disease presenting to the hospital secondary to fall with punctate hemorrhages secondary to contusion along with a C2 fracture.  During hospital stay patient has had worsening mentation especially noted today. EEG to evaluate for seizure  Level of alertness: awake, asleep  AEDs during EEG study: None  Technical aspects: This EEG study was done with scalp electrodes positioned according to the 10-20 International system of electrode placement. Electrical activity was acquired at a sampling rate of 500Hz  and reviewed with a high frequency filter of 70Hz  and a low frequency filter of 1Hz . EEG data were recorded continuously and digitally stored.   DESCRIPTION: No clear posterior dominant rhythm was seen. Sleep was characterized by sleep spindles (12-14hz ), maximal frontocentral. EEG showed continuous generalized 3-6hz  theta-delta slowing. Sharp waves with triphasic morphology, generalized, maximal bifrontal were also noted. Hyperventilation and photic stimulation were not performed.  ABNORMALITY - Sharp waves with triphasic morphology, maximal bifrontal - Continuous slow, generalized   IMPRESSION: This study showed bifrontal sharp waves with triphasic morphology which could be potentially epileptogenic given underlying bifrontal subarachnoid hemorrhage. Additionally, there is moderate diffuse encephalopathy, non specific to etiology. No seizures were seen throughout the recording.  Cristle Jared 

## 2020-03-01 ENCOUNTER — Inpatient Hospital Stay (HOSPITAL_COMMUNITY): Payer: Medicare Other

## 2020-03-01 DIAGNOSIS — Z7189 Other specified counseling: Secondary | ICD-10-CM

## 2020-03-01 DIAGNOSIS — Z515 Encounter for palliative care: Secondary | ICD-10-CM

## 2020-03-01 DIAGNOSIS — Z66 Do not resuscitate: Secondary | ICD-10-CM

## 2020-03-01 LAB — BASIC METABOLIC PANEL
Anion gap: 6 (ref 5–15)
Anion gap: 12 (ref 5–15)
BUN: 61 mg/dL — ABNORMAL HIGH (ref 8–23)
BUN: 71 mg/dL — ABNORMAL HIGH (ref 8–23)
CO2: 27 mmol/L (ref 22–32)
CO2: 26 mmol/L (ref 22–32)
Calcium: 9.8 mg/dL (ref 8.9–10.3)
Calcium: 9.6 mg/dL (ref 8.9–10.3)
Chloride: 129 mmol/L — ABNORMAL HIGH (ref 98–111)
Chloride: 123 mmol/L — ABNORMAL HIGH (ref 98–111)
Creatinine, Ser: 1.42 mg/dL — ABNORMAL HIGH (ref 0.61–1.24)
Creatinine, Ser: 1.79 mg/dL — ABNORMAL HIGH (ref 0.61–1.24)
GFR calc Af Amer: 53 mL/min — ABNORMAL LOW (ref >60)
GFR calc Af Amer: 40 mL/min — ABNORMAL LOW (ref >60)
GFR calc non Af Amer: 46 mL/min — ABNORMAL LOW (ref >60)
GFR calc non Af Amer: 35 mL/min — ABNORMAL LOW (ref >60)
Glucose, Bld: 163 mg/dL — ABNORMAL HIGH (ref 70–99)
Glucose, Bld: 181 mg/dL — ABNORMAL HIGH (ref 70–99)
Potassium: 4 mmol/L (ref 3.5–5.1)
Potassium: 4 mmol/L (ref 3.5–5.1)
Sodium: 162 mmol/L (ref 135–145)
Sodium: 161 mmol/L (ref 135–145)

## 2020-03-01 LAB — BLOOD GAS, ARTERIAL
Acid-Base Excess: 2.9 mmol/L — ABNORMAL HIGH (ref 0.0–2.0)
Bicarbonate: 26.2 mmol/L (ref 20.0–28.0)
Drawn by: 406621
FIO2: 36
O2 Saturation: 81.2 %
Patient temperature: 37
pCO2 arterial: 35.3 mmHg (ref 32.0–48.0)
pH, Arterial: 7.483 — ABNORMAL HIGH (ref 7.350–7.450)
pO2, Arterial: 46.7 mmHg — ABNORMAL LOW (ref 83.0–108.0)

## 2020-03-01 LAB — CBC
HCT: 41.6 % (ref 39.0–52.0)
Hemoglobin: 12.8 g/dL — ABNORMAL LOW (ref 13.0–17.0)
MCH: 28.9 pg (ref 26.0–34.0)
MCHC: 30.8 g/dL (ref 30.0–36.0)
MCV: 93.9 fL (ref 80.0–100.0)
Platelets: 269 10*3/uL (ref 150–400)
RBC: 4.43 MIL/uL (ref 4.22–5.81)
RDW: 15.8 % — ABNORMAL HIGH (ref 11.5–15.5)
WBC: 17.4 10*3/uL — ABNORMAL HIGH (ref 4.0–10.5)
nRBC: 0 % (ref 0.0–0.2)

## 2020-03-01 LAB — GLUCOSE, CAPILLARY
Glucose-Capillary: 109 mg/dL — ABNORMAL HIGH (ref 70–99)
Glucose-Capillary: 112 mg/dL — ABNORMAL HIGH (ref 70–99)

## 2020-03-01 MED ORDER — GLYCOPYRROLATE 0.2 MG/ML IJ SOLN: 0.1000 mg | Freq: Once | INTRAMUSCULAR | Status: DC

## 2020-03-01 MED ORDER — IPRATROPIUM-ALBUTEROL 0.5-2.5 (3) MG/3ML IN SOLN: 3.0000 mL | Freq: Four times a day (QID) | RESPIRATORY_TRACT | Status: DC | PRN

## 2020-03-01 MED ORDER — DEXTROSE 5 % IV SOLN: INTRAVENOUS | Status: DC

## 2020-03-01 MED ORDER — ACETAMINOPHEN 650 MG RE SUPP: 650.0000 mg | RECTAL | Status: DC | PRN

## 2020-03-01 MED ORDER — BIOTENE DRY MOUTH MT LIQD: 15.0000 mL | OROMUCOSAL | Status: DC | PRN

## 2020-03-01 MED ORDER — HYPROMELLOSE (GONIOSCOPIC) 2.5 % OP SOLN
1.0000 [drp] | Freq: Three times a day (TID) | OPHTHALMIC | Status: DC | PRN
  Filled 2020-03-01: qty 15

## 2020-03-01 MED ORDER — CARBIDOPA-LEVODOPA 25-100 MG PO TABS
2.0000 | ORAL_TABLET | Freq: Three times a day (TID) | ORAL | Status: DC
  Administered 2020-03-01: 2
  Filled 2020-03-01 (×2): qty 2

## 2020-03-01 MED ORDER — GLYCOPYRROLATE 0.2 MG/ML IJ SOLN
0.4000 mg | INTRAMUSCULAR | Status: DC
  Administered 2020-03-01 (×2): 0.4 mg via INTRAVENOUS
  Filled 2020-03-01 (×2): qty 2

## 2020-03-01 MED ORDER — CARBIDOPA-LEVODOPA 25-100 MG PO TABS
2.0000 | ORAL_TABLET | ORAL | Status: DC
  Filled 2020-03-01 (×2): qty 2

## 2020-03-01 MED ORDER — BISACODYL 10 MG RE SUPP: 10.0000 mg | Freq: Every day | RECTAL | Status: DC | PRN

## 2020-03-01 MED ORDER — LORAZEPAM 2 MG/ML IJ SOLN: 0.5000 mg | INTRAMUSCULAR | Status: DC | PRN

## 2020-03-01 NOTE — Progress Notes (Signed)
Nutrition Follow-up  DOCUMENTATION CODES:   Severe malnutrition in context of chronic illness  INTERVENTION:  Continue Pivot 1.5 formula via Cortrak NGT at goal rate of 55 ml/hr.   Free water flushes of 300 ml every 4 hours per tube per MD.  Tube feeding regimen to provide 1980 kcal, 123 grams of protein, and 2803 ml free water.   NUTRITION DIAGNOSIS:   Severe Malnutrition related to chronic illness(COPD/Parkinson's) as evidenced by severe muscle depletion, severe fat depletion; ongoing  GOAL:   Patient will meet greater than or equal to 90% of their needs; met with TF  MONITOR:   TF tolerance, Labs, Weight trends  REASON FOR ASSESSMENT:   Consult Enteral/tube feeding initiation and management  ASSESSMENT:   Pt with PMH of Parkinson's, COPD, HLD, GERD, anxiety, and HTN admitted after ground level fall with traumatic SAH and type 2 dens fx in c-collar. 4/12 cortrak placed, tip gastric   Pt continues on NPO status with tube feeding infusing. Pt did not awaken to RD visit. Pt encephalopathic with decreased mental status. Pt with new DNR/DNI ordered. Palliative has been consulted. Plans to continue tube feeding orders. Family not at point of making decisions at this time.   Labs and medications reviewed. Sodium elevated at 162. Free water flushes have been ordered per MD. Pt additionally on dextrose 5% IV solution.   Diet Order:   Diet Order            Diet NPO time specified  Diet effective now              EDUCATION NEEDS:   No education needs have been identified at this time  Skin:  Skin Assessment: Reviewed RN Assessment  Last BM:  4/16  Height:   Ht Readings from Last 1 Encounters:  02/17/2020 5' 10"  (1.778 m)    Weight:   Wt Readings from Last 1 Encounters:  02/28/20 61.1 kg    Ideal Body Weight:  75.4 kg  BMI:  Body mass index is 19.33 kg/m.  Estimated Nutritional Needs:   Kcal:  1900-2100  Protein:  110-125 grams  Fluid:  2  L/day   Corrin Parker, MS, RD, LDN RD pager number/after hours weekend pager number on Amion.

## 2020-03-01 NOTE — Consult Note (Addendum)
Consultation Note Date: 03/01/2020   Patient Name: Casey Moses  DOB: 1936/10/25  MRN: [id number]  Age / Sex: 84 y.o., male  PCP: Casey Center., MD Referring Physician: Md, Trauma, MD  Reason for Consultation: Discussion with family. Goals of Care  HPI/Patient Profile:  Casey Moses is an 84 y.o. male who got up to go to the bathroom and lost balance and fell. He sustained an SAH in the setting of fall in addition to a C2 fracture.   Per discussion with trauma team, Casey Moses has been doing poorly over the last 48 hours. They had prior broached the topic of Palliative care involvement though the family was curious to see if neurology would be of benefit at that time. It appears per discussion with nursing staff that Casey Moses condition has worsened. Today he was made a DNAR/DNI.   Palliative care was asked to get involved to help aid in goals of care conversations.   Clinical Assessment and Goals of Care: I have reviewed medical records including EPIC notes, labs and imaging, received report from bedside RN, assessed the patient. Upon bedside evaluate patient is somnolent and not responsive to me. He has course rhonchi throughout and a coretrack in place.    I met with Casey Moses (spouse) and Casey Moses (son) to further discuss diagnosis prognosis, GOC, EOL wishes, disposition and options.   I introduced Palliative Medicine as specialized medical care for people living with serious illness. It focuses on providing relief from the symptoms and stress of a serious illness. The goal is to improve quality of life for both the patient and the family.  I asked Casey Moses to tell me about [name] as a man. He is from [facility]. 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 He has been married to his wife for 59 one years. They share a son together, Casey Moses and a daughter who has passed away, Casey Moses. He was a  prayerful man and practiced the [place]. He use to sing in his church choir.  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 He loved golfing. He has a dog, CeCe a fourteen pound [place] terrier who be also adores.   In terms of bADLs, Casey Moses was helping with preparing medications. The patient could bath himself with assistance as well as feeding himself. He was still mobile leading up to his fall and was not using any assistive aids.  A detailed discussion was had today regarding advanced directives, per family Casey Moses is his Media planner.   Concepts specific to code status, artifical feeding and hydration, continued IV antibiotics and rehospitalization was had. We discussed how poorly [name] had been doing over the past forty-eight hours and the unlikely chance of significant improvements.  Casey Moses and Casey Moses agreed that sometimes these decisions are made easier by fate.They had struggled with the idea of possibly placed a PEG tube in him, ultimately deciding that it would not be in his best interest.   The difference between a  aggressive medical intervention path  and a palliative comfort care path for this patient at this time was had.   We discussed CODE STATUS which family vocalized patient had been a DNR on admission, but it does not seem the medical team was aware of this. Verified DNAR/DNI.  We talked about transition to comfort measures in house and what that would entail inclusive of medications to control pain, dyspnea, agitation, nausea, itching, and hiccups.  Reviewed  stopping all uneccessary measures such as blood draws, needle sticks, coretrack feeding, mIVF, and frequent vital signs.   Casey Moses and Casey Moses shared many wonderful stories about Casey Moses during our conversation. More recently prior to [place] they had all enjoyed time at a local winery for Casey Moses birthday celebration. Utilized reflective listening throughout our time  together.   Decision Maker: Casey Moses (Spouse) [phone]  SUMMARY OF RECOMMENDATIONS   DNAR/DNI  Comfort measures  Chaplain consult  Code Status/Advance Care Planning:  DNR  Symptom Management:  Dyspnea:  - Morphine 2-63m IV Q1H PRN Pain: Fever:  - Tylenol 6568mPO/PR Q6H PRN  - Tylenol as above Agitation: Anxiety:  - Lorazepam 0.5-51m29mV  Q1H PRN Nausea:  - Zofran 4mg79m/IV Q6H PRN  Secretions:  - Glycopyrrolate 0.4mg 48mQ4H ATC Dry Eyes:  - Artificial Tears PRN Xerostomia:  - Biotene 15ml 13m - BID oral care Urinary Retention:  - Bladder scan if > 250ml p39m and maintain foley  Constipation:  - Bisacodyl 10mg PR2m QDay Spiritual:  - Chaplain consult  Palliative Prophylaxis:   Aspiration, Bowel Regimen, Delirium Protocol, Eye Care, Frequent Pain Assessment, Oral Care, Palliative Wound Care and Turn Reposition  Additional Recommendations (Limitations, Scope, Preferences):  Full Comfort Care  Psycho-social/Spiritual:   Desire for further Chaplaincy support: Yes  Additional Recommendations: Compassionate care  Prognosis:   Hours - Days  Discharge Planning: Anticipated Hospital Death versus at a residential hospice    Primary Diagnoses: Present on Admission: . Dens fracture (HCC)  I [place] reviewed the medical record, interviewed the patient and family, and examined the patient. The following aspects are pertinent. Past Medical History:  Diagnosis Date  . Anxiety   . Asthma   . COPD (chronic obstructive pulmonary disease) (HCC)   .[place] (gastroesophageal reflux disease)   . Hyperlipidemia   . Hypertension   . Parkinson's disease (HCC)   [facility]   Socioeconomic History  . Marital status: Married    Spouse name: Not on file  . Number of children: Not on file  . Years of education: Not on file  . Highest education level: Not on file  Occupational History  . Not on file  Tobacco Use  . Smoking status: Former Smoker  Research scientist (life sciences)keless tobacco: Never Used  Substance and Sexual Activity  . Alcohol use: Yes    Comment: wine occ  . Drug use: No  . Sexual activity: Not on file  Other Topics Concern  . Not on file  Social History Narrative  . Not on file   Social Determinants of Health   Financial Resource Strain:   . Difficulty of Paying Living Expenses:   Food Insecurity:   . Worried About Running Charity fundraiserLast Year:   . Ran Out ArboriculturistLast Year:   Transportation Needs:   . Lack of Film/video editorl):   . Lack oMarland Kitchen Transportation (Non-Medical):   Physical Activity:   . Days of Exercise per Week:   .  Minutes of Exercise per Session:   Stress:   . Feeling of Stress :   Social Connections:   . Frequency of Communication with Friends and Family:   . Frequency of Social Gatherings with Friends and Family:   . Attends Religious Services:   . Active Member of Clubs or Organizations:   . Attends Archivist Meetings:   Marland Kitchen Marital Status:    Family History  Problem Relation Age of Onset  . Hypertension Mother   . Hypertension Father    Scheduled Meds: . amLODipine  10 mg Per Tube BID  . atenolol  25 mg Per Tube Daily  . carbidopa-levodopa  2 tablet Per Tube TID  . chlorhexidine  15 mL Mouth Rinse BID  . Chlorhexidine Gluconate Cloth  6 each Topical Daily  . docusate  100 mg Per Tube BID  . donepezil  10 mg Per Tube BID  . free water  300 mL Per Tube Q4H  . guaiFENesin  5 mL Per Tube Q6H  . mouth rinse  15 mL Mouth Rinse q12n4p  . mometasone-formoterol  2 puff Inhalation BID  . sertraline  100 mg Per Tube Daily  . tamsulosin  0.4 mg Oral Daily   Continuous Infusions: . cefTRIAXone (ROCEPHIN)  IV 2 g (02/29/20 1333)  . dextrose 100 mL/hr at 03/01/20 1136  . feeding supplement (PIVOT 1.5 CAL) 55 mL/hr at 03/01/20 0700   PRN Meds:.acetaminophen, hydrALAZINE, ipratropium-albuterol, LORazepam, metoprolol tartrate, morphine injection, ondansetron **OR** ondansetron  (ZOFRAN) IV Medications Prior to Admission:  Prior to Admission medications   Medication Sig Start Date End Date Taking? Authorizing Provider  ALPRAZolam Duanne Moron) 0.5 MG tablet Take 0.5-1 mg by mouth at bedtime as needed for anxiety.    Yes [provider]  amLODipine (NORVASC) 10 MG tablet Take 10 mg by mouth daily. 12/22/19  Yes [provider]  aspirin EC 81 MG tablet Take 81 mg by mouth daily.   Yes [provider]  atorvastatin (LIPITOR) 40 MG tablet Take 40 mg by mouth daily.   Yes [provider]  carbidopa-levodopa (SINEMET CR) 50-200 MG tablet Take 1 tablet by mouth 3 (three) times daily. 12/22/19  Yes [provider]  cholecalciferol (VITAMIN D3) 25 MCG (1000 UNIT) tablet Take 1,000 Units by mouth daily.   Yes [provider]  donepezil (ARICEPT) 10 MG tablet Take 20 mg by mouth at bedtime.  12/22/19  Yes [provider]  memantine (NAMENDA) 10 MG tablet Take 10 mg by mouth 2 (two) times daily. 12/23/19  Yes [provider]  Omega-3 Fatty Acids (FISH OIL PO) Take 1 capsule by mouth daily.   Yes [provider]  omeprazole (PRILOSEC) 40 MG capsule Take 40 mg by mouth daily. 12/22/19  Yes [provider]  potassium chloride SA (KLOR-CON) 20 MEQ tablet Take 20 mEq by mouth 2 (two) times daily. 01/21/20  Yes [provider]  QUEtiapine (SEROQUEL) 25 MG tablet Take 25 mg by mouth 2 (two) times daily.  01/12/20  Yes [provider]  sertraline (ZOLOFT) 100 MG tablet Take 100 mg by mouth daily. 12/22/19  Yes [provider]  tamsulosin (FLOMAX) 0.4 MG CAPS capsule Take 0.4 mg by mouth at bedtime.    Yes [provider]  vitamin B-12 (CYANOCOBALAMIN) 100 MCG tablet Take 100 mcg by mouth daily.   Yes [provider]   Allergies  Allergen Reactions  . Benazepril Cough   Review of Systems  Unable to  perform ROS: Mental status change   Physical Exam Vitals and nursing note  reviewed.  Constitutional:      Appearance: He is ill-appearing.  HENT:     Head: Normocephalic.     Nose: Nose normal.     Mouth/Throat:     Mouth: Mucous membranes are dry.  Eyes:     Pupils: Pupils are equal, round, and reactive to light.  Cardiovascular:     Rate and Rhythm: Normal rate and regular rhythm.  Pulmonary:     Breath sounds: Rhonchi present.     Comments: Extreme effort, tachypnea Abdominal:     Palpations: Abdomen is soft.  Skin:    General: Skin is dry.     Capillary Refill: Capillary refill takes less than 2 seconds.  Neurological:     Mental Status: He is disoriented.    Vital Signs: BP (!) 95/55 (BP Location: Left Arm)   Pulse 60   Temp 98.3 F (36.8 C) (Axillary)   Resp (!) 24   Ht 5' 10"  (1.778 m)   Wt 61.1 kg   SpO2 93%   BMI 19.33 kg/m  Pain Scale: PAINAD   Pain Score: 0-No pain  SpO2: SpO2: 93 % O2 Device:SpO2: 93 % O2 Flow Rate: .O2 Flow Rate (L/min): 7 L/min IO: Intake/output summary:   Intake/Output Summary (Last 24 hours) at 03/01/2020 1339 Last data filed at 03/01/2020 1228 Gross per 24 hour  Intake --  Output 1425 ml  Net -1425 ml   LBM: Last BM Date: (PTA) Baseline Weight: Weight: 72.6 kg Most recent weight: Weight: 61.1 kg     Palliative Assessment/Data: 10%  Time In: 0139 Time Out: 0339 Time Total: 120 Greater than 50%  of this time was spent counseling and coordinating care related to the above assessment and plan.  Signed by: Rosezella Rumpf, NP   Please contact Palliative Medicine Team phone at [phone] for questions and concerns.  For individual provider: See Shea Evans

## 2020-03-01 NOTE — Progress Notes (Signed)
SLP Cancellation Note  Patient Details Name: Casey Moses MRN: [id number] DOB: 08/19/1936   Cancelled treatment:       Reason Eval/Treat Not Completed: Medical issues which prohibited therapy. RN says that pt is not appropriate for SLP today given his mentation and current medical status. Will f/u as able.    Mahala Menghini., M.A. CCC-SLP Acute Rehabilitation Services Pager [phone] Office [phone]  03/01/2020, 1:53 PM

## 2020-03-01 NOTE — Progress Notes (Signed)
ABG collected in LR with no complications. Lab notified Casey Moses) and sample sent to lab via tube station.

## 2020-03-01 NOTE — Progress Notes (Signed)
Spoke with patients wife. She would like to make the patient DNR/DNI. She would like to discuss with Palliative team, will consult.

## 2020-03-01 NOTE — Progress Notes (Signed)
Subjective: Slightly more awake today, but still very encephalopathic.   Exam: Vitals:   03/01/20 0600 03/01/20 0749  BP: (!) 156/73 (!) 167/80  Pulse: 74 75  Resp: (!) 21 17  Temp:  99.1 F (37.3 C)  SpO2: 96% 93%   Gen: In bed, NAD Resp: slightly sonorous respirations.  Abd: soft, nt  Neuro: MS: awakens to noxious stimulation, eeys only open minimally, then he closes them tightly  CR:FVOHK, eyes midline, due to tight closure difficulty  Motor: Increased tone, appears to wiggle toes to commands,  Sensory:responds to nox stim x 4.   Pertinent Labs: Na 155 BUN 24 -> 36 -> 47 ->  56 Cr 1.15, down from 1.23  Impression: 84 yo M with AMS in the setting of parkinsons with dementia, UTI, environmental change, TBI.  Even at baseline, he needed a fair bit of help, and it sounds like his wife was getting towards the limits of what she was able to provide for him.  I suspect that even in the best case scenario, he is going to take at least somewhat of a stepdown, but it is certainly possible given the multiple factors involved that over the next few weeks he may have gradual improvement towards his previous baseline.  I discussed CODE STATUS with his wife who agrees that he would not want to be kept alive on machines and therefore if he has a cardiac or respiratory arrest, she would not want him intubated or have chest compressions, but full support up to that point.  I discussed that any decision to proceed with a feeding tube would be a very big decision in someone's compromises him, and that many people would not want to be kept alive artificially in this manner with his previous baseline.  She expressed understanding, but was not at the point of making a decision at this time.  Recommendations: 1) continue Sinemet 2) continue donepezil 3) avoid neuroleptics, I have removed Haldol from his as needed list. 4) I would consider palliative care consultation.  Ritta Slot, MD [facility] [phone]  If 7pm- 7am, please page neurology on call as listed in AMION.

## 2020-03-01 NOTE — Progress Notes (Signed)
PT Cancellation Note  Patient Details Name: Casey Moses MRN: [id number] DOB: [birth year]   Cancelled Treatment:    Reason Eval/Treat Not Completed: Medical issues which prohibited therapy. Per discussion with RN patient with recent transition to DNR/DNI, hypotensive and not appropriate for PT intervention at this time. Also from chart review palliative care consult pending. PT will follow up after the weekend if physical therapy intervention continues to align with the patient and family goals of care.   Arlyss Gandy 03/01/2020, 2:53 PM

## 2020-03-01 NOTE — Progress Notes (Signed)
   03/01/20 1113  Assess: MEWS Score  Temp 99 F (37.2 C)  BP (!) 107/55  Pulse Rate 60  ECG Heart Rate 60  Resp (!) 31  Level of Consciousness Responds to Pain  SpO2 94 %  O2 Device Nasal Cannula  Patient Activity (if Appropriate) In bed  O2 Flow Rate (L/min) 7 L/min  Assess: MEWS Score  MEWS Temp 0  MEWS Systolic 0  MEWS Pulse 0  MEWS RR 2  MEWS LOC 2  MEWS Score 4  MEWS Score Color Red  Assess: if the MEWS score is Yellow or Red  Were vital signs taken at a resting state? Yes  Treat  MEWS Interventions Other (Comment);Consulted Respiratory Therapy;Administered scheduled meds/treatments (Provider just left and is aware of patient status)  Take Vital Signs  Increase Vital Sign Frequency  Red: Q 1hr X 4 then Q 4hr X 4, if remains red, continue Q 4hrs  Escalate  MEWS: Escalate Red: discuss with charge nurse/RN and provider, consider discussing with RRT  Notify: Charge Nurse/RN  Name of Charge Nurse/RN Notified Gabriel Cirri RN  Date Charge Nurse/RN Notified 03/01/20  Time Charge Nurse/RN Notified 1113  Notify: Provider  Provider Name/Title Leary Roca PA  Date Provider Notified 03/01/20  Time Provider Notified 1030  Notification Type Face-to-face  Notification Reason Change in status;Other (Comment) (Discuss status and plan of care if deteriorates, PA talk wif)  Response See new orders  Date of Provider Response 03/01/20  Time of Provider Response 1030 (and again at 1130 to update. )

## 2020-03-01 NOTE — TOC Progression Note (Signed)
Transition of Care [facility]) - Progression Note    Patient Details  Name: Casey Moses MRN: [id number] Date of Birth: 01-07-1936  [facility]) CM/SW Contact  Astrid Drafts Berna Spare, RN Phone Number: 03/01/2020, 3:43 PM  Clinical Narrative: CM referral for residential hospice facility. Per palliative care NP's notes, family has chosen to transition to full comfort care, and pt is now DNR.  They wish to transfer pt to [facility] (hospice home) in [place] upon bed availability.  Referral called in to [place] with [facility].  (Phone:  [phone]) Facility to follow up with family and TOC Case Manager.  Will provide updates as available.        Expected Discharge Plan: Hospice Medical Facility Barriers to Discharge: Continued Medical Work up  Expected Discharge Plan and Services Expected Discharge Plan: Hospice Medical Facility   Discharge Planning Services: CM Consult   Living arrangements for the past 2 months: Single Family Home                                       Social Determinants of Health (SDOH) Interventions    Readmission Risk Interventions No flowsheet data found.  Quintella Baton, RN, BSN  Trauma/Neuro ICU Case Manager [phone]

## 2020-03-01 NOTE — Progress Notes (Signed)
Leary Roca PA notified RN of new DNR/DNI orders. Reviewed with him over phone call and that he has notified family to come in to see patient. BP is getting softer and MEWS in RED. DNR/Purple band applied to patients left wrist. Will continue to monitor BP closely and notify PA if goes lower. Gabriel Cirri RN

## 2020-03-01 NOTE — Progress Notes (Addendum)
Subjective: CC: Opens eyes. Slightly more awake this morning. Was not able to get Sinemet as it was ER and could not be crushed. Switched back to IR. Noted to have increased secretions that patient is having a hard time clearing and requiring suctioning. On 5L.   Objective: Vital signs in last 24 hours: Temp:  [97.6 F (36.4 C)-99.1 F (37.3 C)] 99.1 F (37.3 C) (04/16 0749) Pulse Rate:  [65-75] 75 (04/16 0749) Resp:  [15-25] 17 (04/16 0749) BP: (143-167)/(71-94) 167/80 (04/16 0749) SpO2:  [93 %-99 %] 93 % (04/16 0749) Last BM Date: (PTA)  Intake/Output from previous day: 04/15 0701 - 04/16 0700 In: -  Out: 720 [Urine:720] Intake/Output this shift: Total I/O In: -  Out: 800 [Urine:800]  PE: General: chronically ill appearing white male who is laying in bed in NAD   HEENT: head is normocephalic, atraumatic.  Sclera are noninjected. Opens eyes. Ears and nose without any masses or lesions.   Neck: collar present  Heart: regular, rate, and rhythm.  Normal s1,s2. No obvious murmurs, gallops, or rubs noted.  Palpable radial and pedal pulses bilaterally Lungs: Upper airway secretions suctioned. Rhonchorus upper airway sounds,  distant at based. Slightly tachypneic. On 5L.  Abd: soft, NT, ND, +BS, no masses, hernias, or organomegaly GU: Ext catheter. Urine cloudy.  MS: extremities appear contracted, no edema in all 4 extremities Skin: warm and dry with no masses, lesions, or rashes Neuro: tremor Psych: unable to assess orientation   Lab Results:  Recent Labs    02/28/20 0448 02/29/20 0227  WBC 22.8* 17.4*  HGB 13.3 12.2*  HCT 41.4 38.4*  PLT 252 237   BMET Recent Labs    02/28/20 0448 02/29/20 0227  NA 153* 155*  K 4.0 3.5  CL 117* 119*  CO2 22 28  GLUCOSE 142* 153*  BUN 47* 56*  CREATININE 1.23 1.15  CALCIUM 10.1 9.8   PT/INR No results for input(s): LABPROT, INR in the last 72 hours. CMP     Component Value Date/Time   NA 155 (H) 02/29/2020 0227     K 3.5 02/29/2020 0227   CL 119 (H) 02/29/2020 0227   CO2 28 02/29/2020 0227   GLUCOSE 153 (H) 02/29/2020 0227   BUN 56 (H) 02/29/2020 0227   CREATININE 1.15 02/29/2020 0227   CALCIUM 9.8 02/29/2020 0227   PROT 6.8 10/16/2017 1150   ALBUMIN 4.2 10/16/2017 1150   AST 27 10/16/2017 1150   ALT 10 (L) 10/16/2017 1150   ALKPHOS 67 10/16/2017 1150   BILITOT 1.3 (H) 10/16/2017 1150   GFRNONAA 59 (L) 02/29/2020 0227   GFRAA >60 02/29/2020 0227   Lipase  No results found for: LIPASE     Studies/Results: EEG  Result Date: 02/29/2020 Lora Havens, MD     02/29/2020  4:36 PM Patient Name: DAMIAN BUCKLES MRN: [id number] Epilepsy Attending: Lora Havens Referring Physician/Provider: Etta Quill, PA Date: 02/29/2020 Duration: 23.53 mins Patient history: 84 year old male with Parkinson's disease presenting to the hospital secondary to fall with punctate hemorrhages secondary to contusion along with a C2 fracture.  During hospital stay patient has had worsening mentation especially noted today. EEG to evaluate for seizure Level of alertness: awake, asleep AEDs during EEG study: None Technical aspects: This EEG study was done with scalp electrodes positioned according to the 10-20 International system of electrode placement. Electrical activity was acquired at a sampling rate of 500Hz  and reviewed with a high frequency  filter of 70Hz  and a low frequency filter of 1Hz . EEG data were recorded continuously and digitally stored. DESCRIPTION: No clear posterior dominant rhythm was seen. Sleep was characterized by sleep spindles (12-14hz ), maximal frontocentral. EEG showed continuous generalized 3-6hz  theta-delta slowing. Sharp waves with triphasic morphology, generalized, maximal bifrontal were also noted. Hyperventilation and photic stimulation were not performed. ABNORMALITY - Sharp waves with triphasic morphology, maximal bifrontal - Continuous slow, generalized IMPRESSION: This study showed bifrontal  sharp waves with triphasic morphology which could be potentially epileptogenic given underlying bifrontal subarachnoid hemorrhage. Additionally, there is moderate diffuse encephalopathy, non specific to etiology. No seizures were seen throughout the recording.   CT HEAD WO CONTRAST  Result Date: 02/29/2020 CLINICAL DATA:  Follow-up subarachnoid hemorrhage EXAM: CT HEAD WITHOUT CONTRAST TECHNIQUE: Contiguous axial images were obtained from the base of the skull through the vertex without intravenous contrast. COMPARISON:  02/26/2020, 02/25/2020 FINDINGS: Brain: Small amount of subarachnoid hemorrhage again noted within frontal sulci bilaterally, similar prior study. Punctate hyperdense area again seen within the right frontal lobe which could reflect a small punctate intracerebral hemorrhage, stable. Small amount of subarachnoid  that is small amount of layering intraventricular blood within the occipital horn of the left lateral ventricle. No new areas of hemorrhage. Prominent low-density extra-axial fluid along the right convexity may reflect subdural hygroma, stable. No mass effect or midline shift. There is atrophy and chronic small vessel disease changes. Vascular: No hyperdense vessel or unexpected calcification. Skull: No acute calvarial abnormality. Sinuses/Orbits: Visualized paranasal sinuses and mastoids clear. No acute findings Other: None IMPRESSION: Areas of subarachnoid hemorrhage in the frontal regions bilaterally are stable. Punctate hyperdense area in the right frontal lobe stable. Stable intraventricular blood in the left occipital horn. No new or enlarging areas of hemorrhage. Electronically Signed   By: 04/27/2020 M.D.   On: 02/29/2020 15:37   MR BRAIN WO CONTRAST  Result Date: 02/29/2020 CLINICAL DATA:  Encephalopathy EXAM: MRI HEAD WITHOUT CONTRAST TECHNIQUE: Multiplanar, multiecho pulse sequences of the brain and surrounding structures were obtained without  intravenous contrast. COMPARISON:  Head CT 02/29/2020 FINDINGS: Brain: Multifocal intra and extra-axial hemorrhage. There is a small amount of blood in the occipital horn of the left lateral ventricle multifocal subdural blood the right hemisphere and multifocal intraparenchymal petechial hemorrhage within the right greater than left radiata. Multifocal white matter hyperintensity, most commonly due to chronic ischemic microangiopathy. There is generalized atrophy without lobar predilection. No mass normal midline structures. Vascular: Normal flow voids Skull and upper cervical spine: Known fracture of C2 Sinuses/Orbits: Negative Other: None IMPRESSION: 1. Multifocal intra and extra-axial hemorrhage, greatest within the right hemisphere. 2. Small amount of blood in the occipital horn of the left lateral ventricle. No hydrocephalus. 3. Known C2 fracture. Electronically Signed   By: 03/01/2020 M.D.   On: 02/29/2020 21:22   DG CHEST PORT 1 VIEW  Result Date: 02/28/2020 CLINICAL DATA:  Leukocytosis EXAM: PORTABLE CHEST 1 VIEW COMPARISON:  February 08, 2018 FINDINGS: Lungs are mildly hyperexpanded. No evident edema or airspace opacity. Heart size and pulmonary vascularity are normal. No adenopathy. There is aortic atherosclerosis. Feeding tube tip is in the stomach. No bone lesions. IMPRESSION: Lungs mildly hyperexpanded without edema or airspace opacity. Cardiac silhouette normal. Aortic Atherosclerosis (ICD10-I70.0). Feeding tube tip in stomach. Electronically Signed   By: 03/01/2020 III M.D.   On: 02/28/2020 10:11    Anti-infectives: Anti-infectives (From admission, onward)   Start     Dose/Rate Route  Frequency Ordered Stop   02/28/20 1415  cefTRIAXone (ROCEPHIN) 2 g in sodium chloride 0.9 % 100 mL IVPB  Status:  Discontinued    Note to Pharmacy: Pharmacy may adjust dosing strength / duration / interval for maximal efficacy   2 g 200 mL/hr over 30 Minutes Intravenous Every 24 hours 02/28/20 1328  02/28/20 1328   02/28/20 1415  cefTRIAXone (ROCEPHIN) 2 g in sodium chloride 0.9 % 100 mL IVPB    Note to Pharmacy: Pharmacy may adjust dosing strength / duration / interval for maximal efficacy   2 g 200 mL/hr over 30 Minutes Intravenous Every 24 hours 02/28/20 1328         Assessment/Plan GL[facility]-Repeat CT 4/15 stable.MRI results noted. Cont with TBI team therapies, hold ASA per Dr. Maisie Fus. C2 dens fx type II -collar per Dr. Maisie Fus Parkinson's- Appreciate Neurology input. Carbidopa-Levodopa changed to home med timing; EEG without specific etiology. MRI brain. Neurology recommends giving patient a few days on home meds to see if there is a change in patients condition.  HTN- Home meds. Amlodipine has been increased to 10 mg BID. Monitor.  Hypokalemia - Resolved  Hypomagnesemia - Resolved  Hypernatremia - Am labs pending. Free water increased to 300 cc q4h on 4/15 UTI - Started IV Rocephin 4/14. Cx w/ multiple species. Resent.  Resp - Upper airway secretions. Guaifenesin to help thin secretions. PRN duonebs. CXR. FEN - Speech therapy, continueTF's ID - WBC 17 4/16. Tmax 99.7. AM labs pending. Rocephin 4/14 >> VTE - SCDs, chemical dvt ppx once cleared by neurosurgery Foley - External    Dispo- AM labs and CXR. PT/OT recommending SNF, if patient is discharged to SNF will need PEG placement prior to discharge. Ongoing family discussions. Awaiting to see if mental status improves after neuro med changes.    LOS: 6 days    Jacinto Halim , [facility] 03/01/2020, 8:36 AM Please see Amion for pager number during day hours 7:00am-4:30pm

## 2020-03-02 LAB — URINE CULTURE: Culture: 20000 — AB

## 2020-03-16 NOTE — Discharge Summary (Signed)
DEATH SUMMARY   Patient Details  Name: Casey Moses MRN: [id number] DOB: 1935/11/21  Admission/Discharge Information   Admit Date:  03/21/20  Date of Death: Date of Death: 03-28-20  Time of Death: Time of Death: 0031  Length of Stay: 7  Referring Physician: Elijio Miles., MD   Reason(s) for Hospitalization  84 yo male with fall from standing height with isolated neuro injuries of SAH and c2 dens fx type II with minimal displacement. Baseline dementia.  Diagnoses  Preliminary cause of death: TBI complicated by respiratory failure  Secondary Diagnoses (including complications and co-morbidities):  Active Problems:   Dens fracture (HCC)   Intraparenchymal hematoma of brain (HCC)   Protein-calorie malnutrition, severe   Palliative care by specialist   Goals of care, counseling/discussion   DNR (do not resuscitate)   Terminal care Parkinson's disease HTN UTI  Brief Hospital Course (including significant findings, care, treatment, and services provided and events leading to death)  Casey Moses is a 84 yo male got up to go to the bathroom and lost balance and fell. He is unsure if he lost consciousness. He got up himself. He presented with neck pain. He was found to have SAH and c2 dens fx type II with minimal displacement. Trauma admitted. NS consulted and recommended holding ASA, and collar for dens fx. Patient worked with PT/OT who recommended CIR. Speech was consulted for underlying dysphagia and dysarthria. Speech recommended NPO. Cortrack was placed for TF's. Repeat CT head 4/12 showed resolving ICH. Patients mental status declined during hospital stay and became encephalopathic. He was found to have a UTI and was started on abx. PT/OT changed recs to SNF. Neurology was consulted for decline in mental status with patients underlying Parkinson's. Workup was inconclusive. Please see their notes for further detail. Discussion with patients wife about code status occurred on  4/16. She wished to make the patient DNR/DNI. Palliative care was consulted on 4/16. After discussions, patient was moved to comfort care. Patient passed at 29-Mar-2023 at 0031 per notes. I was not present at the time of passing.   Pertinent Labs and Studies  Significant Diagnostic Studies EEG  Result Date: 02/29/2020 Casey Quest, MD     02/29/2020  4:36 PM Patient Name: Casey Moses MRN: [id number] Epilepsy Attending: Charlsie Moses Referring Physician/Provider: Felicie Morn, PA Date: 02/29/2020 Duration: 23.53 mins Patient history: 84 year old male with Parkinson's disease presenting to the hospital secondary to fall with punctate hemorrhages secondary to contusion along with a C2 fracture.  During hospital stay patient has had worsening mentation especially noted today. EEG to evaluate for seizure Level of alertness: awake, asleep AEDs during EEG study: None Technical aspects: This EEG study was done with scalp electrodes positioned according to the 10-20 International system of electrode placement. Electrical activity was acquired at a sampling rate of 500Hz  and reviewed with a high frequency filter of 70Hz  and a low frequency filter of 1Hz . EEG data were recorded continuously and digitally stored. DESCRIPTION: No clear posterior dominant rhythm was seen. Sleep was characterized by sleep spindles (12-14hz ), maximal frontocentral. EEG showed continuous generalized 3-6hz  theta-delta slowing. Sharp waves with triphasic morphology, generalized, maximal bifrontal were also noted. Hyperventilation and photic stimulation were not performed. ABNORMALITY - Sharp waves with triphasic morphology, maximal bifrontal - Continuous slow, generalized IMPRESSION: This study showed bifrontal sharp waves with triphasic morphology which could be potentially epileptogenic given underlying bifrontal subarachnoid hemorrhage. Additionally, there is moderate diffuse encephalopathy, non specific to etiology. No seizures  were seen  throughout the recording. Casey Moses   CT HEAD WO CONTRAST  Result Date: 02/29/2020 CLINICAL DATA:  Follow-up subarachnoid hemorrhage EXAM: CT HEAD WITHOUT CONTRAST TECHNIQUE: Contiguous axial images were obtained from the base of the skull through the vertex without intravenous contrast. COMPARISON:  02/26/2020, 02/25/2020 FINDINGS: Brain: Small amount of subarachnoid hemorrhage again noted within frontal sulci bilaterally, similar prior study. Punctate hyperdense area again seen within the right frontal lobe which could reflect a small punctate intracerebral hemorrhage, stable. Small amount of subarachnoid that is small amount of layering intraventricular blood within the occipital horn of the left lateral ventricle. No new areas of hemorrhage. Prominent low-density extra-axial fluid along the right convexity may reflect subdural hygroma, stable. No mass effect or midline shift. There is atrophy and chronic small vessel disease changes. Vascular: No hyperdense vessel or unexpected calcification. Skull: No acute calvarial abnormality. Sinuses/Orbits: Visualized paranasal sinuses and mastoids clear. No acute findings Other: None IMPRESSION: Areas of subarachnoid hemorrhage in the frontal regions bilaterally are stable. Punctate hyperdense area in the right frontal lobe stable. Stable intraventricular blood in the left occipital horn. No new or enlarging areas of hemorrhage. Electronically Signed   By: Charlett Nose M.D.   On: 02/29/2020 15:37   CT HEAD WO CONTRAST  Result Date: 02/26/2020 CLINICAL DATA:  Altered mental status.  Follow-up hemorrhage. EXAM: CT HEAD WITHOUT CONTRAST TECHNIQUE: Contiguous axial images were obtained from the base of the skull through the vertex without intravenous contrast. COMPARISON:  03-18-20 and 02/25/2020 FINDINGS: Brain: Ventricles and cisterns are normal. Continued small amount of acute hemorrhage layering in the left occipital horn. The scattered areas of bilateral  acute subarachnoid hemorrhage are becoming more difficult to visualize bilaterally with only minimal residual hemorrhage noted. No new areas of acute hemorrhage. No significant mass effect or midline shift. Possible small stable right convexity subdural hygroma. Remainder of the exam is unchanged. Vascular: No hyperdense vessel or unexpected calcification. Skull: Unchanged. Sinuses/Orbits: Unchanged including subtle air-fluid level over the right maxillary sinus. Other: Persistent small scalp contusion over the right frontal region. IMPRESSION: 1. Continued interval resolution of patient's scattered areas of bilateral acute direct hemorrhage. Stable subtle dependent acute hemorrhage over the dependent portion of the left occipital horn. No new areas of hemorrhage. 2.  Stable small right frontal scalp contusion. Electronically Signed   By: Elberta Fortis M.D.   On: 02/26/2020 14:58   CT HEAD WO CONTRAST  Result Date: 02/25/2020 CLINICAL DATA:  Subarachnoid hemorrhage, follow-up EXAM: CT HEAD WITHOUT CONTRAST TECHNIQUE: Contiguous axial images were obtained from the base of the skull through the vertex without intravenous contrast. COMPARISON:  03-18-20, 2018 FINDINGS: Brain: There has been some interval redistribution of scattered sulcal subarachnoid hemorrhage, noting new small volume layering hemorrhage within the left occipital horn. Stable possible superimposed minimal parenchymal hemorrhage, for example a punctate focus of hyperdensity along the right superior frontal gyrus (series 4, image 25). There is slightly increased prominence of hypodense extra-axial space along the right frontal convexity mom which may reflect a thin subdural hygroma. No associated mass effect. There is no new hemorrhage or significant mass effect. No new loss of gray-white differentiation. Ventricles and sulci are stable in size and configuration. Vascular: No new finding. Skull: Calvarium is unremarkable. Sinuses/Orbits: Patchy  lobular mucosal thickening. No new orbital finding. Other: Persistent right scalp soft tissue swelling/hematoma. IMPRESSION: Interval redistribution of small volume scattered sulcal subarachnoid hemorrhage. Stable possible superimposed minimal parenchymal hemorrhage. Suspected thin right frontal convexity  subdural hygroma. No new hemorrhage or mass effect. Electronically Signed   By: Guadlupe SpanishPraneil  Patel M.D.   On: 02/25/2020 08:38   CT Head Wo Contrast  Result Date: 01-23-20 CLINICAL DATA:  Fall, hematoma.  Dementia. EXAM: CT HEAD WITHOUT CONTRAST CT CERVICAL SPINE WITHOUT CONTRAST TECHNIQUE: Multidetector CT imaging of the head and cervical spine was performed following the standard protocol without intravenous contrast. Multiplanar CT image reconstructions of the cervical spine were also generated. COMPARISON:  Head CT 10/16/2017 FINDINGS: CT HEAD FINDINGS Brain: There several foci of discrete high-density hemorrhage along the cortex of the high RIGHT parietal lobe, LEFT parietal lobe and LEFT frontal lobe (image 24, 23, and 26 of series 2). Lesions are most likely subarachnoid hemorrhages. Punctate parenchymal hemorrhage in the high RIGHT frontal lobe on image 27/2). Additionally subarachnoid hemorrhage in the LEFT lateral temporal lobe (image 14/2). Findings consistent with combination parenchymal and subarachnoid hemorrhage, small volume. There is no midline shift or mass effect. Intraventricular hemorrhage. No extra-axial fluid collections. No CT evidence of acute cortical infarction. Vascular: No hyperdense vessel or unexpected calcification. Skull: No skull fracture Sinuses/Orbits: Small amount fluid in the RIGHT maxillary sinus with mucosal wall thickening. Other: Large RIGHT frontal scalp hematoma without associated skull fracture. CT CERVICAL SPINE FINDINGS Alignment: Normal alignment of the vertebral bodies. Skull base and vertebrae: There is a horizontal fracture through the base of the dens of the C2  vertebral body. There is minimal posterior displacement of the superior fracture fragment by 1 mm (image 33/6). The body of the C2 vertebral body is intact. The C1 vertebral body is intact. The craniocervical junction is normal. No additional evidence of fracture of the lower cervical vertebral bodies. Soft tissues and spinal canal: No prevertebral fluid or swelling. No visible canal hematoma. Disc levels: Multiple as endplate spurring from C3-C7. Mild disc space narrowing and sclerosis. No acute subluxation. Upper chest: Negative. Other: None IMPRESSION: 1. Multiple small foci of subarachnoid and parenchymal hemorrhage within the LEFT and RIGHT cerebral hemispheres most consistent with shear force trauma. Majority of the hemorrhages are subarachnoid. 2. No midline shift or mass effect. 3. Large RIGHT frontal scalp hematoma 4. Type II dens fracture with minimal (1 mm) posterior displacement the superior fracture fragment. 5. Multilevel disc osteophytic disease. Critical Value/emergent results were called by telephone at the time of interpretation on 01-23-20 at 8:24 am to provider DAN FLOYD , who verbally acknowledged these results. Electronically Signed   By: Genevive BiStewart  Edmunds M.D.   On: 003-09-21 08:27   CT Cervical Spine Wo Contrast  Result Date: 01-23-20 CLINICAL DATA:  Fall, hematoma.  Dementia. EXAM: CT HEAD WITHOUT CONTRAST CT CERVICAL SPINE WITHOUT CONTRAST TECHNIQUE: Multidetector CT imaging of the head and cervical spine was performed following the standard protocol without intravenous contrast. Multiplanar CT image reconstructions of the cervical spine were also generated. COMPARISON:  Head CT 10/16/2017 FINDINGS: CT HEAD FINDINGS Brain: There several foci of discrete high-density hemorrhage along the cortex of the high RIGHT parietal lobe, LEFT parietal lobe and LEFT frontal lobe (image 24, 23, and 26 of series 2). Lesions are most likely subarachnoid hemorrhages. Punctate parenchymal hemorrhage  in the high RIGHT frontal lobe on image 27/2). Additionally subarachnoid hemorrhage in the LEFT lateral temporal lobe (image 14/2). Findings consistent with combination parenchymal and subarachnoid hemorrhage, small volume. There is no midline shift or mass effect. Intraventricular hemorrhage. No extra-axial fluid collections. No CT evidence of acute cortical infarction. Vascular: No hyperdense vessel or unexpected calcification. Skull: No  skull fracture Sinuses/Orbits: Small amount fluid in the RIGHT maxillary sinus with mucosal wall thickening. Other: Large RIGHT frontal scalp hematoma without associated skull fracture. CT CERVICAL SPINE FINDINGS Alignment: Normal alignment of the vertebral bodies. Skull base and vertebrae: There is a horizontal fracture through the base of the dens of the C2 vertebral body. There is minimal posterior displacement of the superior fracture fragment by 1 mm (image 33/6). The body of the C2 vertebral body is intact. The C1 vertebral body is intact. The craniocervical junction is normal. No additional evidence of fracture of the lower cervical vertebral bodies. Soft tissues and spinal canal: No prevertebral fluid or swelling. No visible canal hematoma. Disc levels: Multiple as endplate spurring from W1-X9. Mild disc space narrowing and sclerosis. No acute subluxation. Upper chest: Negative. Other: None IMPRESSION: 1. Multiple small foci of subarachnoid and parenchymal hemorrhage within the LEFT and RIGHT cerebral hemispheres most consistent with shear force trauma. Majority of the hemorrhages are subarachnoid. 2. No midline shift or mass effect. 3. Large RIGHT frontal scalp hematoma 4. Type II dens fracture with minimal (1 mm) posterior displacement the superior fracture fragment. 5. Multilevel disc osteophytic disease. Critical Value/emergent results were called by telephone at the time of interpretation on 03-19-20 at 8:24 am to provider DAN FLOYD , who verbally acknowledged these  results. Electronically Signed   By: Suzy Bouchard M.D.   On: 03-19-2020 08:27   MR BRAIN WO CONTRAST  Result Date: 02/29/2020 CLINICAL DATA:  Encephalopathy EXAM: MRI HEAD WITHOUT CONTRAST TECHNIQUE: Multiplanar, multiecho pulse sequences of the brain and surrounding structures were obtained without intravenous contrast. COMPARISON:  Head CT 02/29/2020 FINDINGS: Brain: Multifocal intra and extra-axial hemorrhage. There is a small amount of blood in the occipital horn of the left lateral ventricle multifocal subdural blood the right hemisphere and multifocal intraparenchymal petechial hemorrhage within the right greater than left radiata. Multifocal white matter hyperintensity, most commonly due to chronic ischemic microangiopathy. There is generalized atrophy without lobar predilection. No mass normal midline structures. Vascular: Normal flow voids Skull and upper cervical spine: Known fracture of C2 Sinuses/Orbits: Negative Other: None IMPRESSION: 1. Multifocal intra and extra-axial hemorrhage, greatest within the right hemisphere. 2. Small amount of blood in the occipital horn of the left lateral ventricle. No hydrocephalus. 3. Known C2 fracture. Electronically Signed   By: Ulyses Jarred M.D.   On: 02/29/2020 21:22   DG CHEST PORT 1 VIEW  Result Date: 03/01/2020 CLINICAL DATA:  Shortness of breath EXAM: PORTABLE CHEST 1 VIEW COMPARISON:  February 28, 2020 FINDINGS: Feeding tube tip is below the diaphragm. Lungs are clear. Heart size and pulmonary vascularity are normal. No adenopathy. There is aortic atherosclerosis. No bone lesions. IMPRESSION: Lungs clear. Cardiac silhouette within normal limits. Feeding tube tip below diaphragm. Aortic Atherosclerosis (ICD10-I70.0). Electronically Signed   By: Lowella Grip III M.D.   On: 03/01/2020 10:54   DG CHEST PORT 1 VIEW  Result Date: 02/28/2020 CLINICAL DATA:  Leukocytosis EXAM: PORTABLE CHEST 1 VIEW COMPARISON:  February 08, 2018 FINDINGS: Lungs are  mildly hyperexpanded. No evident edema or airspace opacity. Heart size and pulmonary vascularity are normal. No adenopathy. There is aortic atherosclerosis. Feeding tube tip is in the stomach. No bone lesions. IMPRESSION: Lungs mildly hyperexpanded without edema or airspace opacity. Cardiac silhouette normal. Aortic Atherosclerosis (ICD10-I70.0). Feeding tube tip in stomach. Electronically Signed   By: Lowella Grip III M.D.   On: 02/28/2020 10:11   DG Knee Complete 4 Views Right  Result Date:  Mar 13, 2020 CLINICAL DATA:  RIGHT hand and foot pain following fall. EXAM: RIGHT KNEE - COMPLETE 4+ VIEW COMPARISON:  None. FINDINGS: No fracture of the proximal tibia or distal femur. Patella is normal. No joint effusion. IMPRESSION: No fracture or dislocation.  No joint effusion Electronically Signed   By: Genevive Bi M.D.   On: 03-13-20 10:03   DG Hand Complete Right  Result Date: 03-13-20 CLINICAL DATA:  Right hand pain post fall EXAM: RIGHT HAND - COMPLETE 3+ VIEW COMPARISON:  None FINDINGS: First CMC degenerative changes. Degenerative changes about the distal interphalangeal joints with "cecal" appearance, associated with joint space narrowing. Mild erosion along the margin of the joint of the proximal interphalangeal joints of the second and third digits. No signs of acute fracture or dislocation. IMPRESSION: 1. No acute fracture or dislocation. 2. Findings of erosive osteoarthritis. Electronically Signed   By: Donzetta Kohut M.D.   On: 03/13/2020 10:04    Microbiology Recent Results (from the past 240 hour(s))  Culture, Urine     Status: Abnormal   Collection Time: 02/28/20  9:06 AM   Specimen: Urine, Catheterized  Result Value Ref Range Status   Specimen Description URINE, CATHETERIZED  Final   Special Requests   Final    NONE Performed at [facility], [address] [place], [place] [address]    Culture MULTIPLE SPECIES PRESENT, SUGGEST RECOLLECTION (A)  Final   Report  Status 02/29/2020 FINAL  Final  Culture, Urine     Status: Abnormal   Collection Time: 02/29/20  5:38 PM   Specimen: Urine, Random  Result Value Ref Range Status   Specimen Description URINE, RANDOM  Final   Special Requests   Final    NONE Performed at [facility], [address] [place], [place] [address]    Culture 20,000 COLONIES/mL ENTEROCOCCUS FAECALIS (A)  Final   Report Status 03/04/2020 FINAL  Final   Organism ID, Bacteria ENTEROCOCCUS FAECALIS (A)  Final      Susceptibility   Enterococcus faecalis - MIC*    AMPICILLIN <=2 SENSITIVE Sensitive     NITROFURANTOIN <=16 SENSITIVE Sensitive     VANCOMYCIN 1 SENSITIVE Sensitive     * 20,000 COLONIES/mL ENTEROCOCCUS FAECALIS    Lab Basic Metabolic Panel: Recent Labs  Lab 03/01/20 0904 03/01/20 1337  NA 162* 161*  K 4.0 4.0  CL 129* 123*  CO2 27 26  GLUCOSE 163* 181*  BUN 61* 71*  CREATININE 1.42* 1.79*  CALCIUM 9.8 9.6   Liver Function Tests: No results for input(s): AST, ALT, ALKPHOS, BILITOT, PROT, ALBUMIN in the last 168 hours. No results for input(s): LIPASE, AMYLASE in the last 168 hours. No results for input(s): AMMONIA in the last 168 hours. CBC: Recent Labs  Lab 03/01/20 0904  WBC 17.4*  HGB 12.8*  HCT 41.6  MCV 93.9  PLT 269   Cardiac Enzymes: No results for input(s): CKTOTAL, CKMB, CKMBINDEX, TROPONINI in the last 168 hours. Sepsis Labs: Recent Labs  Lab 03/01/20 0904  WBC 17.4*    Procedures/Operations  None   [facility]mer Sow [facility] 03/07/2020, 9:55 AM

## 2020-03-16 NOTE — Progress Notes (Signed)
0031: Pt passed. Notified wife and again verified funeral home location. On-call provider notified.   No personal belongings in room. Post-mortem care provided.

## 2020-03-16 DEATH — deceased

## 2021-01-10 IMAGING — CT CT CERVICAL SPINE W/O CM
3 of 4 series · 12 of 33 positions shown, 14 images · non-contrast
Comparison: Head CT 10/16/2017

CLINICAL DATA: Fall, hematoma.  Dementia.

EXAM:
CT HEAD WITHOUT CONTRAST
CT CERVICAL SPINE WITHOUT CONTRAST
TECHNIQUE: Multidetector CT imaging of the head and cervical spine was
performed following the standard protocol without intravenous
contrast. Multiplanar CT image reconstructions of the cervical spine
were also generated.

[Series 5: coronals · coronal · 0.26mm/px · 3 of 67 slices shown]
[im 14/67  bone]
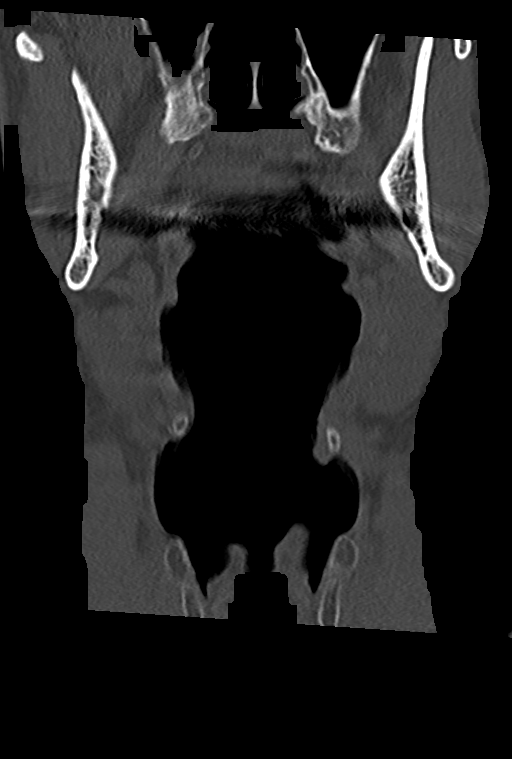
[im 27/67  bone]
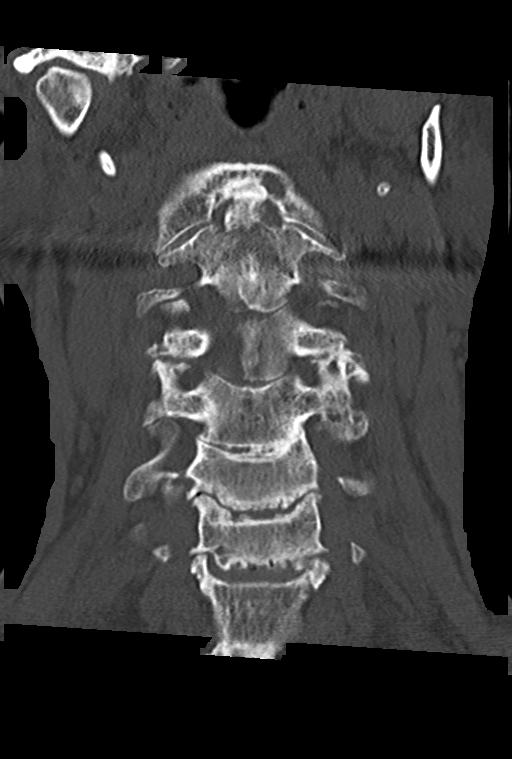
[im 40/67  bone]
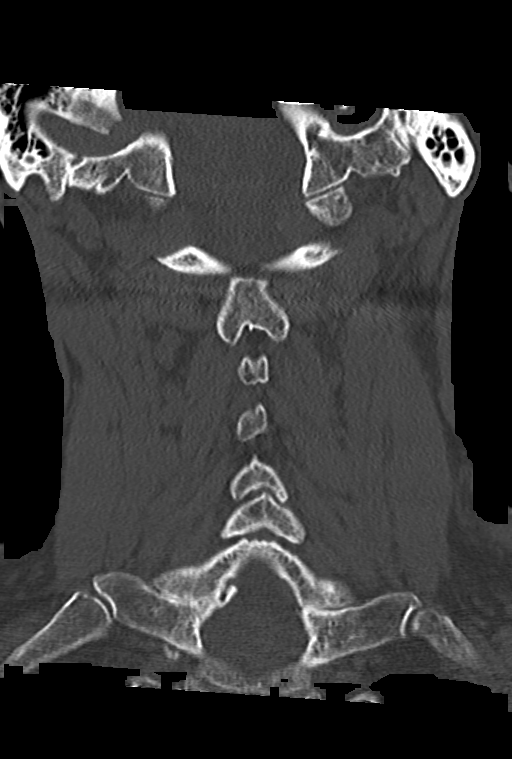

[Series 6: sagittals · sagittal · 0.26mm/px · 5 of 68 slices shown, 6 images]
[im 23/68  bone]
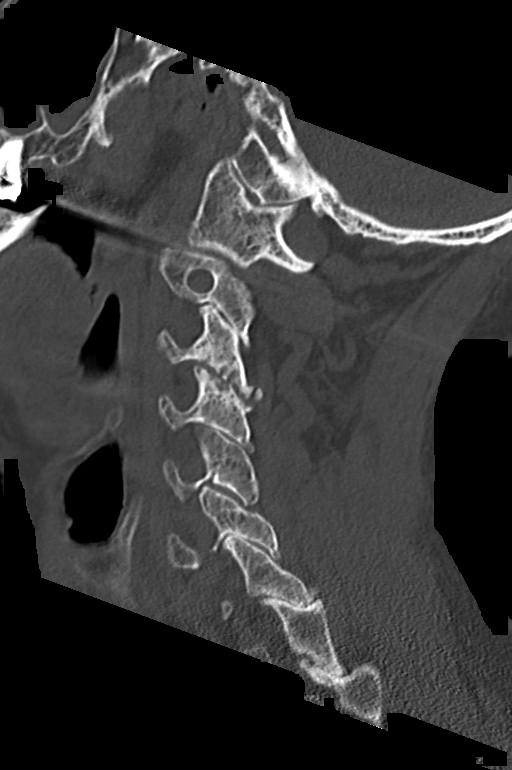
[im 28/68  bone]
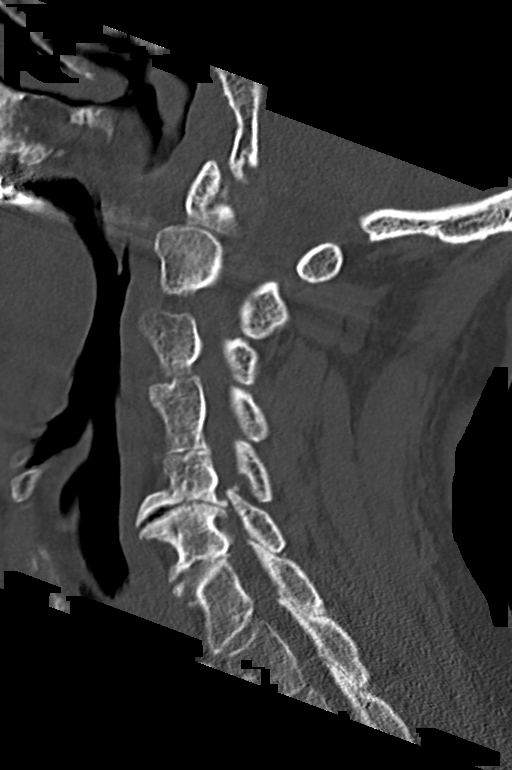
[im 34/68  soft-tissue]
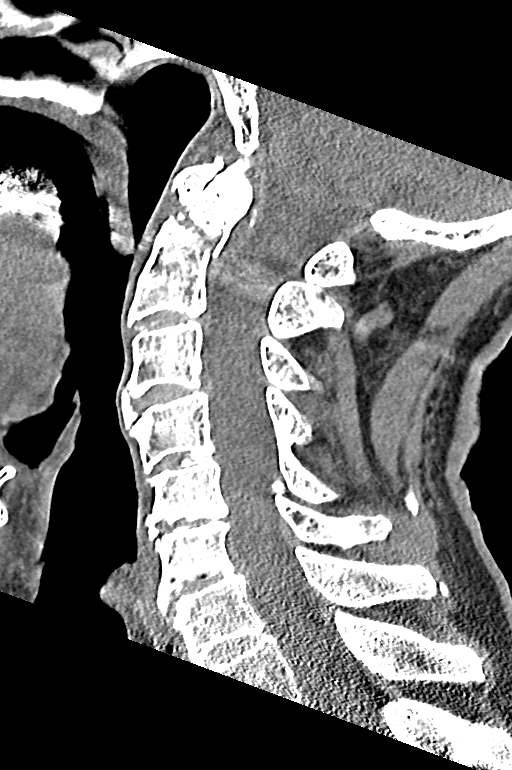
[im 34/68  bone]
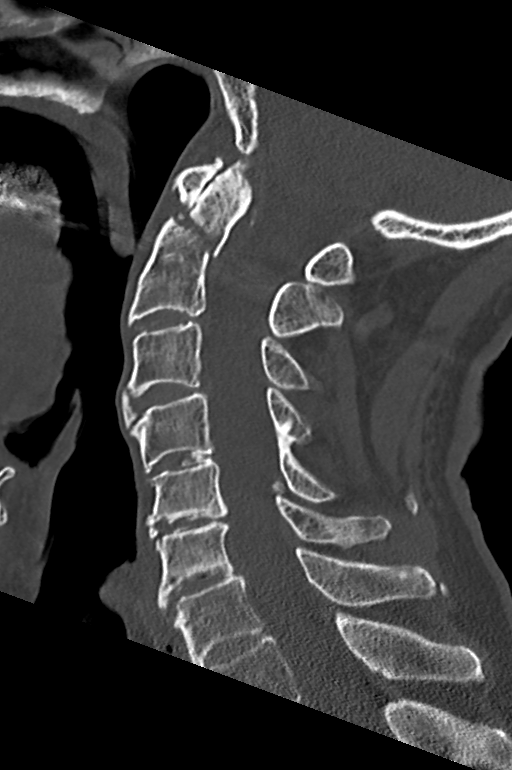
[im 40/68  bone]
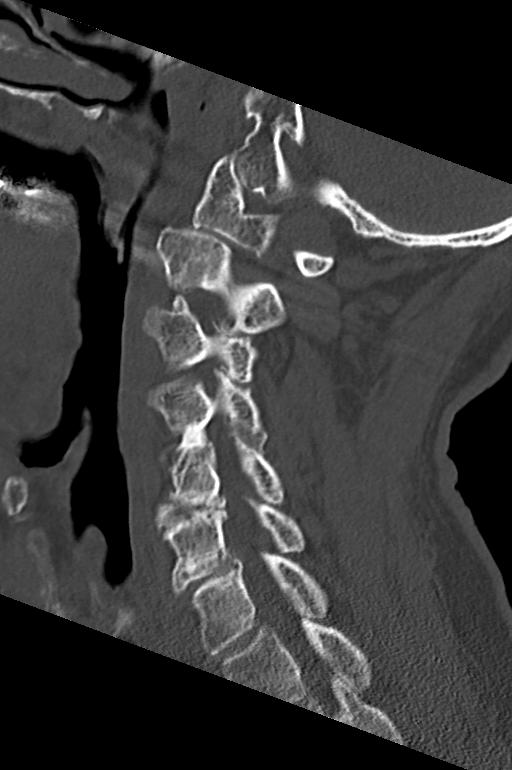
[im 45/68  bone]
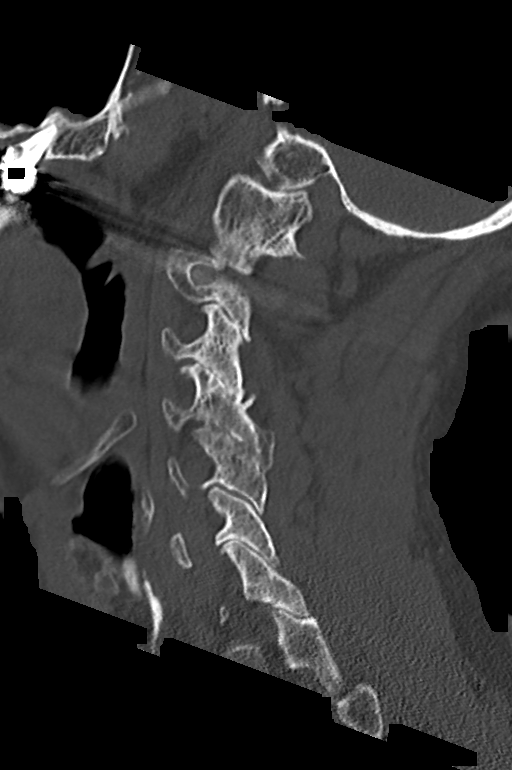

[Series 7: orthogonals · axial · 0.26mm/px · z∈[-346,-205]mm · 4 of 105 slices shown, 5 images]
[im 15/105  soft-tissue]
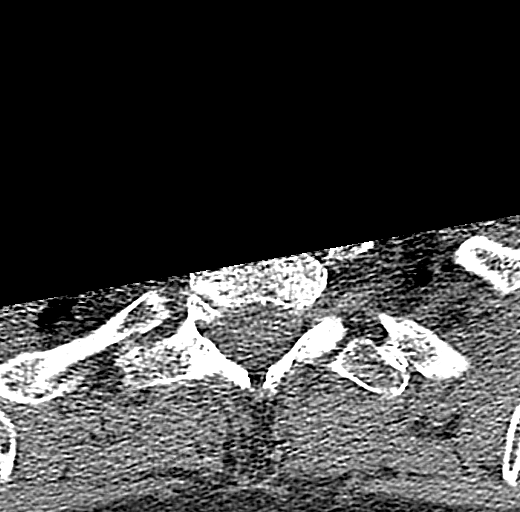
[im 15/105  bone]
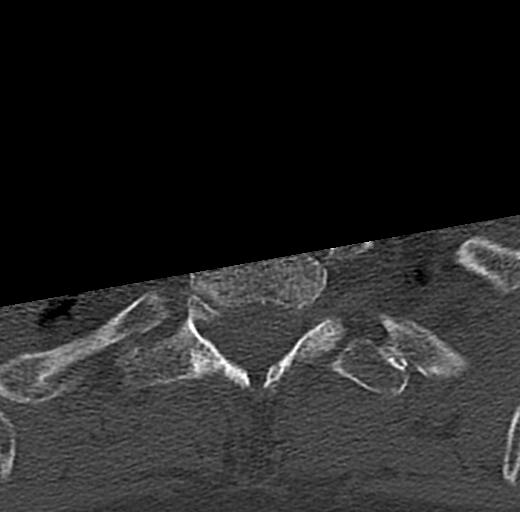
[im 45/105  bone]
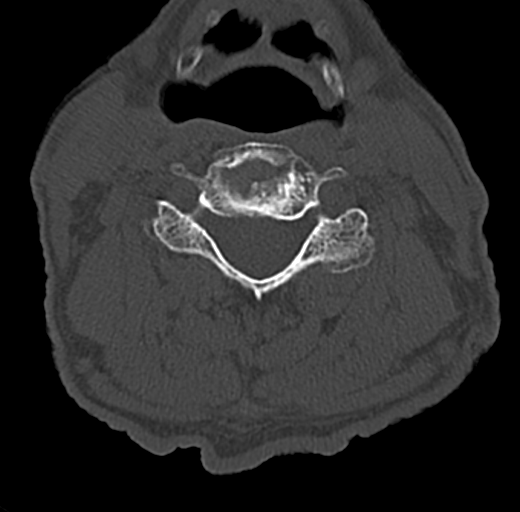
[im 60/105  bone]
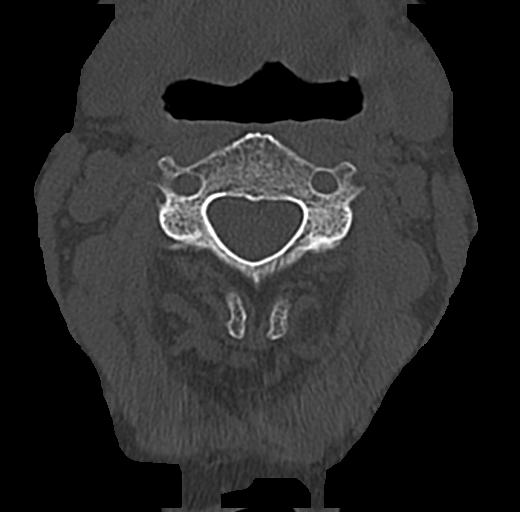
[im 90/105  bone]
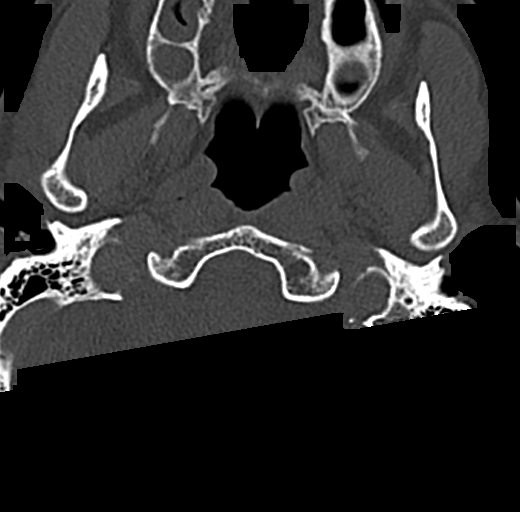

[12 of 33 positions shown; findings below may reference images not displayed]

FINDINGS: CT HEAD FINDINGS

Brain: There several foci of discrete high-density hemorrhage along
the cortex of the high RIGHT parietal lobe, LEFT parietal lobe and
LEFT frontal lobe (image 24, 23, and 26 of series 2). Lesions are
most likely subarachnoid hemorrhages. Punctate parenchymal
hemorrhage in the high RIGHT frontal lobe on image [DATE]).

Additionally subarachnoid hemorrhage in the LEFT lateral temporal
lobe (image [DATE]).

Findings consistent with combination parenchymal and subarachnoid
hemorrhage, small volume.

There is no midline shift or mass effect. Intraventricular
hemorrhage. No extra-axial fluid collections.

No CT evidence of acute cortical infarction.

Vascular: No hyperdense vessel or unexpected calcification.

Skull: No skull fracture

Sinuses/Orbits: Small amount fluid in the RIGHT maxillary sinus with
mucosal wall thickening.

Other: Large RIGHT frontal scalp hematoma without associated skull
fracture.

CT CERVICAL SPINE FINDINGS

Alignment: Normal alignment of the vertebral bodies.

Skull base and vertebrae: There is a horizontal fracture through the
base of the dens of the C2 vertebral body. There is minimal
posterior displacement of the superior fracture fragment by 1 mm
(image 33/6). The body of the C2 vertebral body is intact. The C1
vertebral body is intact. The craniocervical junction is normal.

No additional evidence of fracture of the lower cervical vertebral
bodies.

Soft tissues and spinal canal: No prevertebral fluid or swelling. No
visible canal hematoma.

Disc levels: Multiple as endplate spurring from C3-C7. Mild disc
space narrowing and sclerosis. No acute subluxation.

Upper chest: Negative.

Other: None
IMPRESSION: 1. Multiple small foci of subarachnoid and parenchymal hemorrhage
within the LEFT and RIGHT cerebral hemispheres most consistent with
shear force trauma. Majority of the hemorrhages are subarachnoid.
2. No midline shift or mass effect.
3. Large RIGHT frontal scalp hematoma
4. Type II dens fracture with minimal (1 mm) posterior displacement
the superior fracture fragment.
5. Multilevel disc osteophytic disease.

Critical Value/emergent results were called by telephone at the time
of interpretation on 02/24/2020 at [DATE] to provider INGUNN HARPA RONLOR ,
who verbally acknowledged these results.

## 2021-01-16 IMAGING — DX DG CHEST 1V PORT
1 series · 1 of 1 positions shown · non-contrast
Comparison: February 28, 2020

CLINICAL DATA: Shortness of breath

EXAM:
PORTABLE CHEST 1 VIEW

[chest ap]
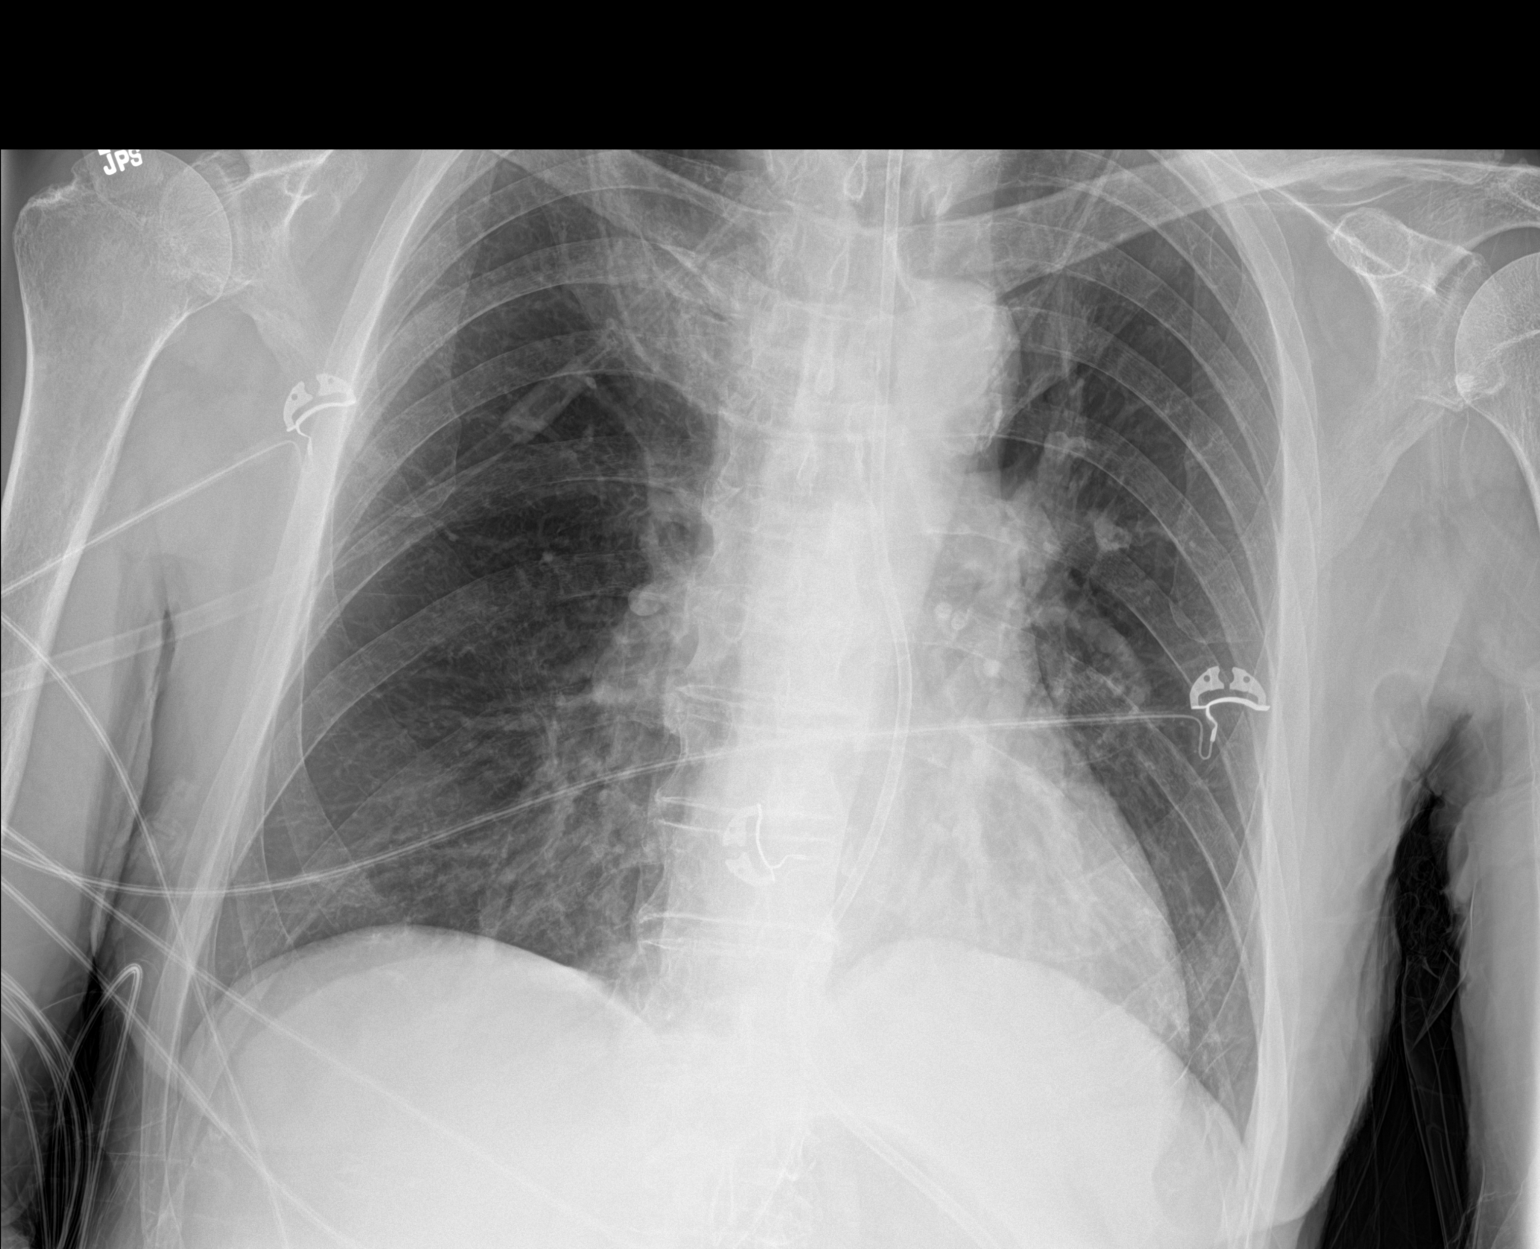

[1 of 1 positions shown; findings below may reference images not displayed]

FINDINGS: Feeding tube tip is below the diaphragm. Lungs are clear. Heart size
and pulmonary vascularity are normal. No adenopathy. There is aortic
atherosclerosis. No bone lesions.
IMPRESSION: Lungs clear. Cardiac silhouette within normal limits. Feeding tube
tip below diaphragm. Aortic Atherosclerosis ([id number].L).
